# Patient Record
Sex: Female | Born: 1960 | Race: White | Hispanic: No | Smoking: Former smoker
Health system: Southern US, Community
[De-identification: ages and names within clinical notes are randomized; demographics above are authoritative.]

## PROBLEM LIST (undated history)

## (undated) DIAGNOSIS — N83209 Unspecified ovarian cyst, unspecified side: Secondary | ICD-10-CM

## (undated) DIAGNOSIS — T7840XA Allergy, unspecified, initial encounter: Secondary | ICD-10-CM

## (undated) DIAGNOSIS — K635 Polyp of colon: Secondary | ICD-10-CM

## (undated) DIAGNOSIS — E039 Hypothyroidism, unspecified: Secondary | ICD-10-CM

## (undated) DIAGNOSIS — D649 Anemia, unspecified: Secondary | ICD-10-CM

## (undated) DIAGNOSIS — D039 Melanoma in situ, unspecified: Secondary | ICD-10-CM

## (undated) DIAGNOSIS — C439 Malignant melanoma of skin, unspecified: Secondary | ICD-10-CM

## (undated) DIAGNOSIS — J302 Other seasonal allergic rhinitis: Secondary | ICD-10-CM

## (undated) DIAGNOSIS — S62101A Fracture of unspecified carpal bone, right wrist, initial encounter for closed fracture: Secondary | ICD-10-CM

## (undated) HISTORY — PX: DILATION AND CURETTAGE OF UTERUS: SHX78

## (undated) HISTORY — DX: Anemia, unspecified: D64.9

## (undated) HISTORY — DX: Hypothyroidism, unspecified: E03.9

## (undated) HISTORY — PX: OTHER SURGICAL HISTORY: SHX169

## (undated) HISTORY — DX: Fracture of unspecified carpal bone, right wrist, initial encounter for closed fracture: S62.101A

## (undated) HISTORY — DX: Melanoma in situ, unspecified: D03.9

## (undated) HISTORY — DX: Malignant melanoma of skin, unspecified: C43.9

## (undated) HISTORY — DX: Unspecified ovarian cyst, unspecified side: N83.209

## (undated) HISTORY — PX: TUBAL LIGATION: SHX77

## (undated) HISTORY — DX: Polyp of colon: K63.5

## (undated) HISTORY — PX: SHOULDER SURGERY: SHX246

## (undated) HISTORY — DX: Other seasonal allergic rhinitis: J30.2

## (undated) HISTORY — DX: Allergy, unspecified, initial encounter: T78.40XA

---

## 1997-11-06 DIAGNOSIS — K635 Polyp of colon: Secondary | ICD-10-CM

## 1997-11-06 HISTORY — DX: Polyp of colon: K63.5

## 1997-11-06 HISTORY — PX: COLONOSCOPY W/ BIOPSIES: SHX1374

## 1997-11-18 ENCOUNTER — Other Ambulatory Visit: Admission: RE | Admit: 1997-11-18 | Discharge: 1997-11-18 | Payer: Self-pay | Admitting: Obstetrics and Gynecology

## 1997-11-21 ENCOUNTER — Ambulatory Visit (HOSPITAL_COMMUNITY): Admission: RE | Admit: 1997-11-21 | Discharge: 1997-11-21 | Payer: Self-pay | Admitting: Gastroenterology

## 1999-06-21 ENCOUNTER — Other Ambulatory Visit: Admission: RE | Admit: 1999-06-21 | Discharge: 1999-06-21 | Payer: Self-pay | Admitting: Obstetrics and Gynecology

## 1999-08-18 ENCOUNTER — Encounter: Admission: RE | Admit: 1999-08-18 | Discharge: 1999-08-18 | Payer: Self-pay | Admitting: Obstetrics and Gynecology

## 1999-08-18 ENCOUNTER — Encounter: Payer: Self-pay | Admitting: Obstetrics and Gynecology

## 2000-08-03 ENCOUNTER — Other Ambulatory Visit: Admission: RE | Admit: 2000-08-03 | Discharge: 2000-08-03 | Payer: Self-pay | Admitting: Obstetrics and Gynecology

## 2000-10-31 ENCOUNTER — Encounter: Admission: RE | Admit: 2000-10-31 | Discharge: 2000-10-31 | Payer: Self-pay | Admitting: Obstetrics and Gynecology

## 2000-10-31 ENCOUNTER — Encounter: Payer: Self-pay | Admitting: Obstetrics and Gynecology

## 2000-11-15 ENCOUNTER — Ambulatory Visit (HOSPITAL_COMMUNITY): Admission: RE | Admit: 2000-11-15 | Discharge: 2000-11-15 | Payer: Self-pay | Admitting: Gastroenterology

## 2000-11-15 ENCOUNTER — Encounter (INDEPENDENT_AMBULATORY_CARE_PROVIDER_SITE_OTHER): Payer: Self-pay | Admitting: Specialist

## 2001-09-24 ENCOUNTER — Other Ambulatory Visit: Admission: RE | Admit: 2001-09-24 | Discharge: 2001-09-24 | Payer: Self-pay | Admitting: Obstetrics and Gynecology

## 2001-12-05 ENCOUNTER — Encounter: Payer: Self-pay | Admitting: Obstetrics and Gynecology

## 2001-12-05 ENCOUNTER — Encounter: Admission: RE | Admit: 2001-12-05 | Discharge: 2001-12-05 | Payer: Self-pay | Admitting: Obstetrics and Gynecology

## 2002-09-23 ENCOUNTER — Other Ambulatory Visit: Admission: RE | Admit: 2002-09-23 | Discharge: 2002-09-23 | Payer: Self-pay | Admitting: Obstetrics and Gynecology

## 2003-01-03 ENCOUNTER — Encounter: Payer: Self-pay | Admitting: Obstetrics and Gynecology

## 2003-01-03 ENCOUNTER — Encounter: Admission: RE | Admit: 2003-01-03 | Discharge: 2003-01-03 | Payer: Self-pay | Admitting: Obstetrics and Gynecology

## 2003-11-24 ENCOUNTER — Ambulatory Visit (HOSPITAL_COMMUNITY): Admission: RE | Admit: 2003-11-24 | Discharge: 2003-11-24 | Payer: Self-pay | Admitting: Gastroenterology

## 2003-11-24 ENCOUNTER — Encounter (INDEPENDENT_AMBULATORY_CARE_PROVIDER_SITE_OTHER): Payer: Self-pay | Admitting: Specialist

## 2003-12-09 ENCOUNTER — Other Ambulatory Visit: Admission: RE | Admit: 2003-12-09 | Discharge: 2003-12-09 | Payer: Self-pay | Admitting: Internal Medicine

## 2004-01-13 ENCOUNTER — Encounter: Admission: RE | Admit: 2004-01-13 | Discharge: 2004-01-13 | Payer: Self-pay | Admitting: Obstetrics and Gynecology

## 2004-12-17 ENCOUNTER — Other Ambulatory Visit: Admission: RE | Admit: 2004-12-17 | Discharge: 2004-12-17 | Payer: Self-pay | Admitting: *Deleted

## 2005-01-25 ENCOUNTER — Encounter: Admission: RE | Admit: 2005-01-25 | Discharge: 2005-01-25 | Payer: Self-pay | Admitting: Obstetrics and Gynecology

## 2005-07-05 ENCOUNTER — Ambulatory Visit: Payer: Self-pay | Admitting: Internal Medicine

## 2005-07-19 ENCOUNTER — Ambulatory Visit: Payer: Self-pay | Admitting: Internal Medicine

## 2005-11-06 HISTORY — PX: INTRAUTERINE DEVICE (IUD) INSERTION: SHX5877

## 2005-12-21 ENCOUNTER — Other Ambulatory Visit: Admission: RE | Admit: 2005-12-21 | Discharge: 2005-12-21 | Payer: Self-pay | Admitting: Obstetrics & Gynecology

## 2006-01-26 ENCOUNTER — Encounter: Admission: RE | Admit: 2006-01-26 | Discharge: 2006-01-26 | Payer: Self-pay | Admitting: Obstetrics & Gynecology

## 2006-06-29 ENCOUNTER — Ambulatory Visit: Payer: Self-pay | Admitting: Internal Medicine

## 2006-07-29 ENCOUNTER — Ambulatory Visit: Payer: Self-pay | Admitting: Family Medicine

## 2007-01-31 ENCOUNTER — Encounter: Admission: RE | Admit: 2007-01-31 | Discharge: 2007-01-31 | Payer: Self-pay | Admitting: Obstetrics & Gynecology

## 2007-02-01 ENCOUNTER — Other Ambulatory Visit: Admission: RE | Admit: 2007-02-01 | Discharge: 2007-02-01 | Payer: Self-pay | Admitting: Obstetrics & Gynecology

## 2007-05-11 ENCOUNTER — Encounter: Payer: Self-pay | Admitting: Internal Medicine

## 2007-05-11 ENCOUNTER — Ambulatory Visit: Payer: Self-pay | Admitting: Vascular Surgery

## 2007-05-24 ENCOUNTER — Ambulatory Visit: Payer: Self-pay | Admitting: Vascular Surgery

## 2007-08-02 ENCOUNTER — Telehealth: Payer: Self-pay | Admitting: Internal Medicine

## 2008-02-05 ENCOUNTER — Encounter: Admission: RE | Admit: 2008-02-05 | Discharge: 2008-02-05 | Payer: Self-pay | Admitting: Obstetrics & Gynecology

## 2008-02-11 ENCOUNTER — Other Ambulatory Visit: Admission: RE | Admit: 2008-02-11 | Discharge: 2008-02-11 | Payer: Self-pay | Admitting: Obstetrics & Gynecology

## 2008-11-25 ENCOUNTER — Telehealth: Payer: Self-pay | Admitting: Internal Medicine

## 2008-12-01 ENCOUNTER — Ambulatory Visit: Payer: Self-pay | Admitting: Internal Medicine

## 2008-12-01 DIAGNOSIS — B079 Viral wart, unspecified: Secondary | ICD-10-CM | POA: Insufficient documentation

## 2009-02-05 ENCOUNTER — Encounter: Admission: RE | Admit: 2009-02-05 | Discharge: 2009-02-05 | Payer: Self-pay | Admitting: Obstetrics & Gynecology

## 2009-10-01 ENCOUNTER — Telehealth: Payer: Self-pay | Admitting: Internal Medicine

## 2009-10-07 ENCOUNTER — Encounter: Payer: Self-pay | Admitting: Internal Medicine

## 2009-11-26 ENCOUNTER — Ambulatory Visit: Payer: Self-pay | Admitting: Internal Medicine

## 2009-11-26 LAB — CONVERTED CEMR LAB: Albumin: 4.2 g/dL (ref 3.5–5.2)

## 2010-02-08 ENCOUNTER — Encounter: Admission: RE | Admit: 2010-02-08 | Discharge: 2010-02-08 | Payer: Self-pay | Admitting: Obstetrics & Gynecology

## 2010-04-29 ENCOUNTER — Ambulatory Visit (HOSPITAL_COMMUNITY)
Admission: RE | Admit: 2010-04-29 | Discharge: 2010-04-29 | Payer: Self-pay | Source: Home / Self Care | Attending: Gastroenterology | Admitting: Gastroenterology

## 2010-04-30 ENCOUNTER — Encounter: Payer: Self-pay | Admitting: Internal Medicine

## 2010-06-10 NOTE — Progress Notes (Signed)
Summary: toe fungus  Phone Note Call from Patient   Caller: Patient Call For: Stacie Glaze MD Summary of Call: [phone] Wants RX for toe fungus. [facility] ([place]) Initial call taken by: Lynann Beaver CMA,  Oct 01, 2009 2:07 PM  Follow-up for Phone Call        lamisil generic 250 mg one by mouth dauly for 90 days needs liver function in 6 weeks ( terbinifin) Follow-up by: Stacie Glaze MD,  Oct 01, 2009 4:19 PM    New/Updated Medications: LAMISIL 250 MG TABS (TERBINAFINE HCL) one by mouth daily x 90 days TERBINAFINE HCL 250 MG TABS (TERBINAFINE HCL) one by mouth daily x 90 days Prescriptions: TERBINAFINE HCL 250 MG TABS (TERBINAFINE HCL) one by mouth daily x 90 days  #90 x 0   Entered by:   Lynann Beaver CMA   Authorized by:   Stacie Glaze MD   Signed by:   Lynann Beaver CMA on 10/01/2009   Method used:   Electronically to        [facility] [phone]* (retail)       [address]       [place], [place]  [address]       Ph: [phone]       Fax: [phone]   RxID:   [id number] LAMISIL 250 MG TABS (TERBINAFINE HCL) one by mouth daily x 90 days  #90 x 0   Entered by:   Lynann Beaver CMA   Authorized by:   Stacie Glaze MD   Signed by:   Lynann Beaver CMA on 10/01/2009   Method used:   Electronically to        [facility] [phone]* (retail)       [address]       [place], [place]  [address]       Ph: [phone]       Fax: [phone]   RxID:   [id number]  Pt notified.

## 2010-06-10 NOTE — Medication Information (Signed)
Summary: Denial of Coverage for Terbinafine Hcl  Denial of Coverage for Terbinafine Hcl   Imported By: Maryln Gottron 10/23/2009 10:13:45  _____________________________________________________________________  External Attachment:    Type:   Image     Comment:   External Document

## 2010-06-10 NOTE — Procedures (Signed)
Summary: Colonoscopy Report/[facility]  Colonoscopy Report/[facility]   Imported By: Maryln Gottron 05/14/2010 13:32:51  _____________________________________________________________________  External Attachment:    Type:   Image     Comment:   External Document

## 2010-09-21 NOTE — Consult Note (Signed)
NEW PATIENT CONSULTATION   CASSIDEY, BARRALES  DOB:  [birth year]                                       05/11/2007  CHART#:[id number]   HISTORY:  The patient presents today for evaluation of lower extremity  venous pathology.  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  She did not recall any formal duplex  evaluation at that time.  She does not have any swelling or venous  varicosities.  She does not have any pain related to this.   PAST MEDICAL HISTORY:  Her past history is negative for major medical  difficulties.   SOCIAL HISTORY:  She does not smoke, having quit in 1985 and has 2-3  alcoholic drinks per week.   REVIEW OF SYSTEMS:  Her review of systems is completely negative.   ALLERGIES:  She has no allergies.   MEDICATIONS:  She is on no meds.   PHYSICAL EXAMINATION:  Vital signs:  Her weight is 170 pounds.  Height  is 5 feet 6 inches.  Blood pressure is 140/86, pulse 70, respirations  16.  General:  A well-developed, well-nourished white female appearing  her stated age of 78.  Extremities:  Her dorsalis pedis pulses are 2+  bilaterally.  She does not have any swelling and does not have any  changes of venous hypertension.  She does have spider vein  telangiectasia more on her right side than her left and more on the  right lateral thigh and right lateral calf.   She underwent a limited venous duplex by me and this showed normal  caliber saphenous vein bilaterally with no evidence of gross reflux.  I  discussed this with the patient.  I explained she does not appear to  have any significant or serious venous disease other than her spider  vein telangiectasia.  She is concerned of these from a cosmetic  standpoint.  I did discuss treatment of these.  I explained that we  currently do not use hypertonic saline due to the pain and potential  hyperpigmentation associated with this.  I explained that Ms. Octavio Graves, RN,  currently performed sclerotherapy in our office and we will schedule an  appointment for her for treatment.   Larina Earthly, M.D.  Electronically Signed   TFE/MEDQ  D:  05/11/2007  T:  05/14/2007  Job:  [id number]   cc:   Stacie Glaze, MD

## 2010-09-24 NOTE — Op Note (Signed)
NAME:  Rachael Munoz, Rachael Munoz                         ACCOUNT NO.:  [id number]   MEDICAL RECORD NO.:  [id number]                   PATIENT TYPE:  AMB   LOCATION:  ENDO                                 FACILITY:  [facility]   PHYSICIAN:  Danise Edge, M.D.                DATE OF BIRTH:  07/30/1960   DATE OF PROCEDURE:  11/24/2003  DATE OF DISCHARGE:                                 OPERATIVE REPORT   PROCEDURE PERFORMED:  Colonoscopy and polypectomy.   PROCEDURE INDICATION:  Ms. Sireen Halk is a 50 year old female born  28-May-1960.  In 1999, Ms. 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  On 11/15/2000,  proctocolonoscopy to the cecum was normal.   ENDOSCOPIST:  Danise Edge, M.D.   PREMEDICATION:  Versed 7.5 mg, fentanyl 60 mcg.   PROCEDURE:  After obtaining informed consent, Ms. Arango was placed in the  left lateral decubitus position.  I administered intravenous fentanyl and  intravenous Versed to achieve conscious sedation for the procedure.  The  patient's blood pressure, oxygen saturation, and cardiac rhythm were  monitored throughout the procedure and documented in the medical record.   Anal inspection and digital rectal exam were normal.  The Olympus adjustable  pediatric colonoscope was introduced into the rectum and advanced to the  cecum.  A normal-appearing ileocecal valve was intubated and the distal  ileum inspected.  Colonic preparation for the exam today was excellent.   Rectum:  Normal.   Sigmoid colon and descending colon:  Two diminutive polyps were removed with  the cold biopsy forceps at approximately 60 cm from the anal verge.   Splenic flexure:  Normal.   Transverse colon:  Normal.   Hepatic flexure:  A 2-mm polyp was lifted by submucosal saline injection and  removed with the electrocautery snare.   Ascending colon:  Normal.   Cecum and ileocecal valve:  Normal.   ASSESSMENT:  A 2-mm size polyp was  removed from the hepatic flexure, and two  diminutive polyps were removed from the proximal sigmoid colon.  All polyps  were submitted in one bottle for pathological evaluation.   PLAN:  Repeat colonoscopy in three years.                                               Danise Edge, M.D.    MJ/MEDQ  D:  11/24/2003  T:  11/24/2003  Job:  [id number]

## 2010-09-24 NOTE — Procedures (Signed)
[place]  Patient:    Rachael Munoz, Rachael Munoz                        MRN: [id number] Proc. Date: 11/15/00 Attending:  Verlin Grills, M.D.                           Procedure Report  DATE OF BIRTH:  1960-11-07.  REFERRING PHYSICIAN:  PROCEDURE PERFORMED:  Colonoscopy with polypectomy.  ENDOSCOPIST:  Verlin Grills, M.D.  INDICATIONS FOR PROCEDURE:  The patient is a 50 year old female.  In 1999 she underwent a colonoscopy; a 2 cm tubulovillous adenoma was removed from her rectum.  She is due for surveillance colonoscopy and polypectomy to prevent colon cancer.  PREMEDICATION:  Fentanyl 50 mcg, Versed 7 mg.  ENDOSCOPE:  Olympus pediatric video colonoscope.  DESCRIPTION OF PROCEDURE:  After obtaining informed consent, the patient was placed in the left lateral decubitus position.  I administered intravenous fentanyl and intravenous Versed to achieve conscious sedation for the procedure.  The patients blood pressure, oxygen saturation and cardiac rhythm were monitored throughout the procedure and documented in the medical record.  Anal inspection was normal.  Digital rectal exam was normal.  The Olympus pediatric video colonoscope was then introduced into the rectum easily advanced to the cecum.  A normal-appearing ileocecal valve was intubated and the distal ileum inspected.  Colonic preparation for the exam today was excellent.  Rectum:  Normal.  Sigmoid colon and descending colon:  At approximately 30 cm from the anal verge, a 2 mm sessile polyp was removed with the electrocautery snare and submitted for pathological interpretation.  Splenic flexure:  Normal.  Transverse colon:  Normal.  Hepatic flexure:  Normal.  Ascending colon:  Normal.  Cecum and ileocecal valve:  Normal.  Distal ileum:  Normal.  ASSESSMENT:   From the distal sigmoid colon at 30 cm from the anal verge, a 2 mm sessile polyp was removed with  electrocautery snare.  Otherwise normal proctocolonoscopy to the cecum.  RECOMMENDATIONS:  Repeat colonoscopy in July 2007. DD:  11/15/00 TD:  11/15/00 Job: [id number]

## 2010-10-08 ENCOUNTER — Telehealth: Payer: Self-pay | Admitting: *Deleted

## 2010-10-08 NOTE — Telephone Encounter (Signed)
Per dr Lovell Sheehan- needs to be seen- cant diagnosis problem without being seen and hasnt see dr Lovell Sheehan in 3 years

## 2010-10-08 NOTE — Telephone Encounter (Signed)
Pt keeps getting infected toes from Pedicures, and wants to be treated over the phone.  Will give [place] the name of the pharmacy after she speaks to [place]?

## 2010-10-12 ENCOUNTER — Telehealth: Payer: Self-pay | Admitting: *Deleted

## 2010-10-12 NOTE — Telephone Encounter (Signed)
Notified pt she needs appt to have a MD to look at her toenail problems.

## 2010-10-20 ENCOUNTER — Ambulatory Visit (INDEPENDENT_AMBULATORY_CARE_PROVIDER_SITE_OTHER): Payer: BC Managed Care – PPO | Admitting: Internal Medicine

## 2010-10-20 ENCOUNTER — Encounter: Payer: Self-pay | Admitting: Internal Medicine

## 2010-10-20 VITALS — BP 124/82 | HR 76 | Temp 98.4°F | Ht 65.0 in | Wt 183.0 lb

## 2010-10-20 DIAGNOSIS — B351 Tinea unguium: Secondary | ICD-10-CM

## 2010-10-20 DIAGNOSIS — B079 Viral wart, unspecified: Secondary | ICD-10-CM

## 2010-10-20 MED ORDER — FLUCONAZOLE 100 MG PO TABS: 100.0000 mg | ORAL_TABLET | Freq: Every day | ORAL | Status: AC

## 2010-10-20 NOTE — Progress Notes (Signed)
  Subjective:    Patient ID: Rachael Munoz, female    DOB: 1960-10-29, 50 y.o.   MRN: [id number]  HPI  Visit for recurrent fungal toenails. They are raised and painful and she failed a 90 day course of Lamisil.   Review of Systems  Constitutional: Negative for activity change, appetite change and fatigue.  HENT: Negative for ear pain, congestion, neck pain, postnasal drip and sinus pressure.   Eyes: Negative for redness and visual disturbance.  Respiratory: Negative for cough, shortness of breath and wheezing.   Gastrointestinal: Negative for abdominal pain and abdominal distention.  Genitourinary: Negative for dysuria, frequency and menstrual problem.  Musculoskeletal: Negative for myalgias, joint swelling and arthralgias.  Skin: Negative for rash and wound.  Neurological: Negative for dizziness, weakness and headaches.  Hematological: Negative for adenopathy. Does not bruise/bleed easily.  Psychiatric/Behavioral: Negative for sleep disturbance and decreased concentration.      History reviewed. No pertinent past medical history. History reviewed. No pertinent past surgical history.  reports that she quit smoking about 25 years ago. She does not have any smokeless tobacco history on file. Her alcohol and drug histories not on file. family history is not on file. No Known Allergies  Objective:   Physical Exam  Constitutional: She is oriented to person, place, and time. She appears well-developed and well-nourished. No distress.  HENT:  Head: Normocephalic and atraumatic.  Right Ear: External ear normal.  Left Ear: External ear normal.  Nose: Nose normal.  Mouth/Throat: Oropharynx is clear and moist.  Eyes: Conjunctivae and EOM are normal. Pupils are equal, round, and reactive to light.  Neck: Normal range of motion. Neck supple. No JVD present. No tracheal deviation present. No thyromegaly present.  Cardiovascular: Normal rate, regular rhythm, normal heart sounds and intact  distal pulses.   No murmur heard. Pulmonary/Chest: Effort normal and breath sounds normal. She has no wheezes. She exhibits no tenderness.  Abdominal: Soft. Bowel sounds are normal.  Musculoskeletal: Normal range of motion. She exhibits no edema and no tenderness.  Lymphadenopathy:    She has no cervical adenopathy.  Neurological: She is alert and oriented to person, place, and time. She has normal reflexes. No cranial nerve deficit.  Skin: Skin is warm and dry. She is not diaphoretic.  Psychiatric: She has a normal mood and affect. Her behavior is normal.          Assessment & Plan:  Failure of Lamisil to treat a fungal nails.  Will try Diflucan 100 mg daily along with vinegar soaks.  She is a wart recurrence on her middle finger on the right hand.  Treatment with cryotherapy for 30 seconds  Informed consent was obtained in the lesion was treated for 60 seconds of liquid nitrogen application the patient tolerated the procedure well as procedural care was discussed with the patient and instructions should the lesion reappears contact our office immediately

## 2010-10-20 NOTE — Patient Instructions (Signed)
You're going to take a medication called Diflucan 100 mg once a day and we will take it for 30 days take a small break and then resume for another 30  I would like you to soak her feet at least 4 times a week for 15-20 minutes in warm water and vinegar soak. Use a gallon of warm water in a tub and had a cup of white vinegar

## 2010-12-20 ENCOUNTER — Telehealth: Payer: Self-pay | Admitting: Internal Medicine

## 2010-12-20 NOTE — Telephone Encounter (Signed)
Pt said that the wart Dr Lovell Sheehan froze off, has come back. Pt is wanting to know, since her Sept ov has been rsc to Oct, if pt can come in to see any doctor to get wart refrozen and if so, would there be another copay, since the procedure didn't fully remove wart the first time?

## 2010-12-21 NOTE — Telephone Encounter (Signed)
Left message on machine for pt 2 times to  Call back and no return call

## 2010-12-23 ENCOUNTER — Ambulatory Visit: Payer: BC Managed Care – PPO | Admitting: Internal Medicine

## 2010-12-23 ENCOUNTER — Encounter: Payer: Self-pay | Admitting: Internal Medicine

## 2010-12-23 ENCOUNTER — Ambulatory Visit (INDEPENDENT_AMBULATORY_CARE_PROVIDER_SITE_OTHER): Payer: BC Managed Care – PPO | Admitting: Internal Medicine

## 2010-12-23 DIAGNOSIS — B079 Viral wart, unspecified: Secondary | ICD-10-CM

## 2010-12-23 NOTE — Progress Notes (Signed)
  Subjective:    Patient ID: Rachael Munoz, female    DOB: 04/11/1961, 50 y.o.   MRN: [id number]  HPI   The patient is a 50 year old white female presents for wart destruction.  She has a wart on her right index finger that is recurrent she states it is tender and painful it involves the pad of her finger.   Review of Systems  Constitutional: Negative for activity change, appetite change and fatigue.  HENT: Negative for ear pain, congestion, neck pain, postnasal drip and sinus pressure.   Eyes: Negative for redness and visual disturbance.  Respiratory: Negative for cough, shortness of breath and wheezing.   Gastrointestinal: Negative for abdominal pain and abdominal distention.  Genitourinary: Negative for dysuria, frequency and menstrual problem.  Musculoskeletal: Negative for myalgias, joint swelling and arthralgias.  Skin: Negative for rash and wound.  Neurological: Negative for dizziness, weakness and headaches.  Hematological: Negative for adenopathy. Does not bruise/bleed easily.  Psychiatric/Behavioral: Negative for sleep disturbance and decreased concentration.       Objective:   Physical Exam  Constitutional: She is oriented to person, place, and time. She appears well-developed and well-nourished. No distress.  HENT:  Head: Normocephalic and atraumatic.  Right Ear: External ear normal.  Left Ear: External ear normal.  Nose: Nose normal.  Mouth/Throat: Oropharynx is clear and moist.  Eyes: Conjunctivae and EOM are normal. Pupils are equal, round, and reactive to light.  Neck: Normal range of motion. Neck supple. No JVD present. No tracheal deviation present. No thyromegaly present.  Cardiovascular: Normal rate, regular rhythm, normal heart sounds and intact distal pulses.   No murmur heard. Pulmonary/Chest: Effort normal and breath sounds normal. She has no wheezes. She exhibits no tenderness.  Abdominal: Soft. Bowel sounds are normal.  Musculoskeletal: Normal range  of motion. She exhibits no edema and no tenderness.  Lymphadenopathy:    She has no cervical adenopathy.  Neurological: She is alert and oriented to person, place, and time. She has normal reflexes. No cranial nerve deficit.  Skin: Skin is warm and dry. She is not diaphoretic.  Psychiatric: She has a normal mood and affect. Her behavior is normal.          Assessment & Plan:  Treatment of wart with cryotherapy.  Phone consent obtained and 50 seconds of liquid nitrogen were applied to the area to work until there was solid freezing post sheet and wound care instructions were given to the patient

## 2011-01-17 ENCOUNTER — Other Ambulatory Visit: Payer: Self-pay | Admitting: Obstetrics & Gynecology

## 2011-01-17 DIAGNOSIS — Z1231 Encounter for screening mammogram for malignant neoplasm of breast: Secondary | ICD-10-CM

## 2011-01-21 ENCOUNTER — Ambulatory Visit: Payer: BC Managed Care – PPO | Admitting: Internal Medicine

## 2011-02-10 ENCOUNTER — Ambulatory Visit
Admission: RE | Admit: 2011-02-10 | Discharge: 2011-02-10 | Disposition: A | Discharge status: Home / Self Care | Payer: BC Managed Care – PPO | Source: Ambulatory Visit | Attending: Obstetrics & Gynecology | Admitting: Obstetrics & Gynecology

## 2011-02-10 DIAGNOSIS — Z1231 Encounter for screening mammogram for malignant neoplasm of breast: Secondary | ICD-10-CM

## 2011-02-18 ENCOUNTER — Ambulatory Visit: Payer: BC Managed Care – PPO | Admitting: Internal Medicine

## 2011-04-09 HISTORY — PX: IUD REMOVAL: SHX5392

## 2011-08-22 ENCOUNTER — Encounter: Payer: Self-pay | Admitting: Internal Medicine

## 2011-08-22 ENCOUNTER — Ambulatory Visit (INDEPENDENT_AMBULATORY_CARE_PROVIDER_SITE_OTHER): Payer: BC Managed Care – PPO | Admitting: Internal Medicine

## 2011-08-22 VITALS — BP 140/90 | HR 72 | Temp 98.2°F | Resp 16 | Ht 65.0 in | Wt 190.0 lb

## 2011-08-22 DIAGNOSIS — M79672 Pain in left foot: Secondary | ICD-10-CM

## 2011-08-22 DIAGNOSIS — M79609 Pain in unspecified limb: Secondary | ICD-10-CM

## 2011-08-22 DIAGNOSIS — E039 Hypothyroidism, unspecified: Secondary | ICD-10-CM

## 2011-08-22 DIAGNOSIS — M109 Gout, unspecified: Secondary | ICD-10-CM

## 2011-08-22 LAB — T3, FREE: T3, Free: 2.6 pg/mL (ref 2.3–4.2)

## 2011-08-22 LAB — T4, FREE: Free T4: 0.95 ng/dL (ref 0.60–1.60)

## 2011-08-22 LAB — URIC ACID: Uric Acid, Serum: 3.8 mg/dL (ref 2.4–7.0)

## 2011-08-22 LAB — TSH: TSH: 1.54 u[IU]/mL (ref 0.35–5.50)

## 2011-08-22 MED ORDER — INDOMETHACIN 50 MG PO CAPS: 50.0000 mg | ORAL_CAPSULE | Freq: Three times a day (TID) | ORAL | Status: AC

## 2011-08-22 MED ORDER — COLCHICINE 0.6 MG PO TABS: 0.6000 mg | ORAL_TABLET | Freq: Two times a day (BID) | ORAL | Status: DC

## 2011-08-22 NOTE — Patient Instructions (Addendum)
The patient is instructed to continue all medications as prescribed. Schedule followup with check out clerk upon leaving the clinic  I believe the significant amount of pain in your foot and warmth that I feel represents a gouty attack. I have attached an information sheet about gout. You'll be taking colchicine twice daily for about a week and indomethacin 3 times a day for a couple of weeks This helps to block the gout out of the joint very quickly and You may feel very good after 2 days adult start exercising for 3-4d  Gout Gout is an inflammatory condition (arthritis) caused by a buildup of uric acid crystals in the joints. Uric acid is a chemical that is normally present in the blood. Under some circumstances, uric acid can form into crystals in your joints. This causes joint redness, soreness, and swelling (inflammation). Repeat attacks are common. Over time, uric acid crystals can form into masses (tophi) near a joint, causing disfigurement. Gout is treatable and often preventable. CAUSES  The disease begins with elevated levels of uric acid in the blood. Uric acid is produced by your body when it breaks down a naturally found substance called purines. This also happens when you eat certain foods such as meats and fish. Causes of an elevated uric acid level include:  Being passed down from parent to child (heredity).   Diseases that cause increased uric acid production (obesity, psoriasis, some cancers).   Excessive alcohol use.   Diet, especially diets rich in meat and seafood.   Medicines, including certain cancer-fighting drugs (chemotherapy), diuretics, and aspirin.   Chronic kidney disease. The kidneys are no longer able to remove uric acid well.   Problems with metabolism.  Conditions strongly associated with gout include:  Obesity.   High blood pressure.   High cholesterol.   Diabetes.  Not everyone with elevated uric acid levels gets gout. It is not understood why  some people get gout and others do not. Surgery, joint injury, and eating too much of certain foods are some of the factors that can lead to gout. SYMPTOMS   An attack of gout comes on quickly. It causes intense pain with redness, swelling, and warmth in a joint.   Fever can occur.   Often, only one joint is involved. Certain joints are more commonly involved:   Base of the big toe.   Knee.   Ankle.   Wrist.   Finger.  Without treatment, an attack usually goes away in a few days to weeks. Between attacks, you usually will not have symptoms, which is different from many other forms of arthritis. DIAGNOSIS  Your caregiver will suspect gout based on your symptoms and exam. Removal of fluid from the joint (arthrocentesis) is done to check for uric acid crystals. Your caregiver will give you a medicine that numbs the area (local anesthetic) and use a needle to remove joint fluid for exam. Gout is confirmed when uric acid crystals are seen in joint fluid, using a special microscope. Sometimes, blood, urine, and X-ray tests are also used. TREATMENT  There are 2 phases to gout treatment: treating the sudden onset (acute) attack and preventing attacks (prophylaxis). Treatment of an Acute Attack  Medicines are used. These include anti-inflammatory medicines or steroid medicines.   An injection of steroid medicine into the affected joint is sometimes necessary.   The painful joint is rested. Movement can worsen the arthritis.   You may use warm or cold treatments on painful joints, depending which works  best for you.   Discuss the use of coffee, vitamin C, or cherries with your caregiver. These may be helpful treatment options.  Treatment to Prevent Attacks After the acute attack subsides, your caregiver may advise prophylactic medicine. These medicines either help your kidneys eliminate uric acid from your body or decrease your uric acid production. You may need to stay on these medicines  for a very long time. The early phase of treatment with prophylactic medicine can be associated with an increase in acute gout attacks. For this reason, during the first few months of treatment, your caregiver may also advise you to take medicines usually used for acute gout treatment. Be sure you understand your caregiver's directions. You should also discuss dietary treatment with your caregiver. Certain foods such as meats and fish can increase uric acid levels. Other foods such as dairy can decrease levels. Your caregiver can give you a list of foods to avoid. HOME CARE INSTRUCTIONS   Do not take aspirin to relieve pain. This raises uric acid levels.   Only take over-the-counter or prescription medicines for pain, discomfort, or fever as directed by your caregiver.   Rest the joint as much as possible. When in bed, keep sheets and blankets off painful areas.   Keep the affected joint raised (elevated).   Use crutches if the painful joint is in your leg.   Drink enough water and fluids to keep your urine clear or pale yellow. This helps your body get rid of uric acid. Do not drink alcoholic beverages. They slow the passage of uric acid.   Follow your caregiver's dietary instructions. Pay careful attention to the amount of protein you eat. Your daily diet should emphasize fruits, vegetables, whole grains, and fat-free or low-fat milk products.   Maintain a healthy body weight.  SEEK MEDICAL CARE IF:   You have an oral temperature above 102 F (38.9 C).   You develop diarrhea, vomiting, or any side effects from medicines.   You do not feel better in 24 hours, or you are getting worse.  SEEK IMMEDIATE MEDICAL CARE IF:   Your joint becomes suddenly more tender and you have:   Chills.   An oral temperature above 102 F (38.9 C), not controlled by medicine.  MAKE SURE YOU:   Understand these instructions.   Will watch your condition.   Will get help right away if you are not  doing well or get worse.  Document Released: 04/22/2000 Document Revised: 04/14/2011 Document Reviewed: 08/03/2009 [facility] Patient Information 2012 [place], [place].

## 2011-08-22 NOTE — Progress Notes (Signed)
  Subjective:    Patient ID: Rachael Munoz, female    DOB: 1961-01-10, 51 y.o.   MRN: [id number]  HPI Painful swelling in her feet that began with starting the synthroid 25 mcg. Most of the edema is isolated in the left foot. No SOB. No trauma. Effecting gate. No gout hx.     Review of Systems  Constitutional: Negative.   HENT: Negative.   Eyes: Negative.   Respiratory: Negative.   Cardiovascular: Negative.   Gastrointestinal: Negative.   Musculoskeletal: Positive for myalgias and joint swelling.   No past medical history on file.  History   Social History  . Marital Status: Legally Separated    Spouse Name: N/A    Number of Children: N/A  . Years of Education: N/A   Occupational History  . Not on file.   Social History Main Topics  . Smoking status: Former Smoker    Quit date: 10/19/1985  . Smokeless tobacco: Not on file  . Alcohol Use: No  . Drug Use: No  . Sexually Active: Not on file   Other Topics Concern  . Not on file   Social History Narrative  . No narrative on file    No past surgical history on file.  No family history on file.  No Known Allergies  No current outpatient prescriptions on file prior to visit.    BP 140/90  Pulse 72  Temp 98.2 F (36.8 C)  Resp 16  Ht 5\' 5"  (1.651 m)  Wt 190 lb (86.183 kg)  BMI 31.62 kg/m2       Objective:   Physical Exam  Nursing note and vitals reviewed. Constitutional: She appears well-developed and well-nourished.  HENT:  Head: Normocephalic and atraumatic.  Eyes: Conjunctivae normal are normal. Pupils are equal, round, and reactive to light.  Cardiovascular: Normal rate and regular rhythm.   Pulmonary/Chest: Effort normal and breath sounds normal.  Musculoskeletal: She exhibits edema and tenderness.  Skin:       No visible rash Trace edema of the lower extremities left greater than right          Assessment & Plan:  Left foot greater than right foot swelling but swelling indeed in  both feet. She states that this began since she started the Synthroid 25 mg we'll monitor her thyroid and see if she needs to be on this medication we will check a T3 and a T4.  I believe this is an inflammatory reaction in the differential diagnosis is arthritis of the feet as well as the possibility of a Morton's neuroma. Differential diagnosis also includes gout other less likely we will measure uric acid

## 2011-08-23 ENCOUNTER — Other Ambulatory Visit: Payer: Self-pay | Admitting: Internal Medicine

## 2011-08-23 DIAGNOSIS — M79672 Pain in left foot: Secondary | ICD-10-CM

## 2011-08-23 NOTE — Progress Notes (Signed)
Pt informed and states foot is some better and will stop meds that she was instructed to stop--xray ordered and pt informed

## 2011-08-24 ENCOUNTER — Ambulatory Visit (INDEPENDENT_AMBULATORY_CARE_PROVIDER_SITE_OTHER)
Admission: RE | Admit: 2011-08-24 | Discharge: 2011-08-24 | Disposition: A | Discharge status: Home / Self Care | Payer: BC Managed Care – PPO | Source: Ambulatory Visit | Attending: Internal Medicine | Admitting: Internal Medicine

## 2011-08-24 DIAGNOSIS — M79609 Pain in unspecified limb: Secondary | ICD-10-CM

## 2011-08-24 DIAGNOSIS — M79672 Pain in left foot: Secondary | ICD-10-CM

## 2011-08-25 ENCOUNTER — Telehealth: Payer: Self-pay | Admitting: *Deleted

## 2011-08-25 NOTE — Telephone Encounter (Signed)
Pt given foot xray results.

## 2011-10-10 ENCOUNTER — Ambulatory Visit (INDEPENDENT_AMBULATORY_CARE_PROVIDER_SITE_OTHER): Payer: BC Managed Care – PPO | Admitting: Internal Medicine

## 2011-10-10 ENCOUNTER — Other Ambulatory Visit: Payer: Self-pay | Admitting: *Deleted

## 2011-10-10 VITALS — BP 150/90 | HR 76 | Temp 98.3°F | Resp 16 | Ht 65.5 in | Wt 188.0 lb

## 2011-10-10 DIAGNOSIS — M79609 Pain in unspecified limb: Secondary | ICD-10-CM

## 2011-10-10 DIAGNOSIS — M722 Plantar fascial fibromatosis: Secondary | ICD-10-CM

## 2011-10-10 DIAGNOSIS — M79673 Pain in unspecified foot: Secondary | ICD-10-CM

## 2011-10-10 NOTE — Progress Notes (Signed)
  Subjective:    Patient ID: Rachael Munoz, female    DOB: 12-22-1960, 51 y.o.   MRN: [id number]  HPI Patient is a healthy 51 year old female on no current medications who presents to discuss foot pain.  She is pain but more foot from her heel to her toe on the plantar surface consistent with plantar fasciitis.  She presents to discuss options for therapy.   Review of Systems  Constitutional: Negative for activity change, appetite change and fatigue.  HENT: Negative for ear pain, congestion, neck pain, postnasal drip and sinus pressure.   Eyes: Negative for redness and visual disturbance.  Respiratory: Negative for cough, shortness of breath and wheezing.   Gastrointestinal: Negative for abdominal pain and abdominal distention.  Genitourinary: Negative for dysuria, frequency and menstrual problem.  Musculoskeletal: Negative for myalgias, joint swelling and arthralgias.  Skin: Negative for rash and wound.  Neurological: Negative for dizziness, weakness and headaches.  Hematological: Negative for adenopathy. Does not bruise/bleed easily.  Psychiatric/Behavioral: Negative for disturbed wake/sleep cycle and decreased concentration.       Objective:   Physical Exam  Constitutional: She appears well-developed and well-nourished.  HENT:  Head: Normocephalic and atraumatic.  Eyes: Conjunctivae normal are normal. Pupils are equal, round, and reactive to light.  Cardiovascular: Normal rate and regular rhythm.   Pulmonary/Chest: Effort normal and breath sounds normal.  Musculoskeletal:       Tenderness along ther fascia          Assessment & Plan:  Acute on chronic plantar fasciitis Discussed the use of stretching arch supports and shoes and the possibility of obtaining a foot brace to wear at night that stretches to put.  Discussion of podiatry has a preferred provider of therapy for the plantar fasciitis.

## 2011-12-20 ENCOUNTER — Other Ambulatory Visit: Payer: Self-pay | Admitting: Obstetrics & Gynecology

## 2011-12-20 DIAGNOSIS — Z1231 Encounter for screening mammogram for malignant neoplasm of breast: Secondary | ICD-10-CM

## 2012-01-31 ENCOUNTER — Encounter: Payer: Self-pay | Admitting: Internal Medicine

## 2012-01-31 NOTE — Patient Instructions (Signed)
Foot stretching exercises discussed

## 2012-02-13 ENCOUNTER — Ambulatory Visit
Admission: RE | Admit: 2012-02-13 | Discharge: 2012-02-13 | Disposition: A | Discharge status: Home / Self Care | Payer: BC Managed Care – PPO | Source: Ambulatory Visit | Attending: Obstetrics & Gynecology | Admitting: Obstetrics & Gynecology

## 2012-02-13 DIAGNOSIS — Z1231 Encounter for screening mammogram for malignant neoplasm of breast: Secondary | ICD-10-CM

## 2013-01-17 ENCOUNTER — Other Ambulatory Visit: Payer: Self-pay

## 2013-01-17 DIAGNOSIS — Z1231 Encounter for screening mammogram for malignant neoplasm of breast: Secondary | ICD-10-CM

## 2013-02-01 ENCOUNTER — Encounter: Payer: Self-pay | Admitting: Obstetrics & Gynecology

## 2013-02-13 ENCOUNTER — Ambulatory Visit
Admission: RE | Admit: 2013-02-13 | Discharge: 2013-02-13 | Disposition: A | Discharge status: Home / Self Care | Payer: BC Managed Care – PPO | Source: Ambulatory Visit

## 2013-02-13 DIAGNOSIS — Z1231 Encounter for screening mammogram for malignant neoplasm of breast: Secondary | ICD-10-CM

## 2013-07-31 ENCOUNTER — Ambulatory Visit: Payer: Self-pay | Admitting: Obstetrics & Gynecology

## 2013-08-01 ENCOUNTER — Encounter: Payer: Self-pay | Admitting: Obstetrics & Gynecology

## 2013-08-02 ENCOUNTER — Ambulatory Visit (INDEPENDENT_AMBULATORY_CARE_PROVIDER_SITE_OTHER): Payer: BC Managed Care – PPO | Admitting: Obstetrics & Gynecology

## 2013-08-02 ENCOUNTER — Encounter: Payer: Self-pay | Admitting: Obstetrics & Gynecology

## 2013-08-02 VITALS — BP 126/64 | HR 64 | Resp 16 | Ht 64.75 in | Wt 195.2 lb

## 2013-08-02 DIAGNOSIS — Z0000 Encounter for general adult medical examination without abnormal findings: Secondary | ICD-10-CM

## 2013-08-02 DIAGNOSIS — Z124 Encounter for screening for malignant neoplasm of cervix: Secondary | ICD-10-CM

## 2013-08-02 DIAGNOSIS — Z01419 Encounter for gynecological examination (general) (routine) without abnormal findings: Secondary | ICD-10-CM

## 2013-08-02 NOTE — Progress Notes (Signed)
53 y.o. G4P3 Legally SeparatedCaucasianF here for annual exam.  No vaginal bleeding.  No hot flashes.  No night sweats.  Donates blood every 8 weeks.    Patient's last menstrual period was 05/09/2006.          Sexually active: no  The current method of family planning is none.    Exercising: yes  walking Smoker:  no  Health Maintenance: Pap:  04/20/11 WNL/negative HR HPV History of abnormal Pap:  no MMG:  02/13/13-normal Colonoscopy:  2/11-repeat in 5 years, Dr. Wynetta Emery BMD:   none TDaP:  04/20/11 Screening Labs: 2012, Hb today: donates blood regularly, Urine today: WBC-+1, PROTEIN-trace   reports that she quit smoking about 27 years ago. She has never used smokeless tobacco. She reports that she drinks about 1.5 ounces of alcohol per week. She reports that she does not use illicit drugs.  Past Medical History  Diagnosis Date  . Hypothyroid   . Colon polyp 7/99    high grade dysplasia   . Ovarian cyst     right  . Anemia     mild-donates blood regularly    Past Surgical History  Procedure Laterality Date  . Colonoscopy w/ biopsies  7/99    high grade dysplasia  . Intrauterine device (iud) insertion  7/07  . Iud removal  12/12  . Dilation and curettage of uterus      Current Outpatient Prescriptions  Medication Sig Dispense Refill  . ALPRAZolam (XANAX) 0.25 MG tablet Take 0.25 mg by mouth at bedtime as needed for anxiety.       No current facility-administered medications for this visit.    Family History  Problem Relation Age of Onset  . CVA Mother   . Diabetes Paternal Grandmother   . Heart disease Maternal Grandfather   . Heart disease Paternal Grandfather     heart attack  . Tuberculosis Sister   . Colitis Father     ROS:  Pertinent items are noted in HPI.  Otherwise, a comprehensive ROS was negative.  Exam:   BP 126/64  Pulse 64  Resp 16  Ht 5' 4.75" (1.645 m)  Wt 195 lb 3.2 oz (88.542 kg)  BMI 32.72 kg/m2  LMP 05/09/2006  Weight change: +9lb    Height: 5' 4.75" (164.5 cm)  Ht Readings from Last 3 Encounters:  08/02/13 5' 4.75" (1.645 m)  10/10/11 5' 5.5" (1.664 m)  08/22/11 5\' 5"  (1.651 m)    General appearance: alert, cooperative and appears stated age Head: Normocephalic, without obvious abnormality, atraumatic Neck: no adenopathy, supple, symmetrical, trachea midline and thyroid normal to inspection and palpation Lungs: clear to auscultation bilaterally Breasts: normal appearance, no masses or tenderness Heart: regular rate and rhythm Abdomen: soft, non-tender; bowel sounds normal; no masses,  no organomegaly Extremities: extremities normal, atraumatic, no cyanosis or edema Skin: Skin color, texture, turgor normal. No rashes or lesions Lymph nodes: Cervical, supraclavicular, and axillary nodes normal. No abnormal inguinal nodes palpated Neurologic: Grossly normal   Pelvic: External genitalia:  no lesions              Urethra:  normal appearing urethra with no masses, tenderness or lesions              Bartholins and Skenes: normal                 Vagina: normal appearing vagina with normal color and discharge, no lesions  Cervix: no lesions              Pap taken: yes Bimanual Exam:  Uterus:  normal size, contour, position, consistency, mobility, non-tender              Adnexa: normal adnexa and no mass, fullness, tenderness               Rectovaginal: Confirms               Anus:  normal sphincter tone, no lesions  A:  Well Woman with normal exam PMP, no HRT Mildly elevated lipids.  Normal HDLs.  Ratio good.  P:   Mammogram yearly pap smear today return annually or prn  An After Visit Summary was printed and given to the patient.

## 2013-08-06 LAB — IPS PAP SMEAR ONLY

## 2013-12-04 ENCOUNTER — Other Ambulatory Visit: Payer: Self-pay

## 2013-12-04 DIAGNOSIS — Z1231 Encounter for screening mammogram for malignant neoplasm of breast: Secondary | ICD-10-CM

## 2014-02-14 ENCOUNTER — Ambulatory Visit
Admission: RE | Admit: 2014-02-14 | Discharge: 2014-02-14 | Disposition: A | Discharge status: Home / Self Care | Payer: BC Managed Care – PPO | Source: Ambulatory Visit

## 2014-02-14 DIAGNOSIS — Z1231 Encounter for screening mammogram for malignant neoplasm of breast: Secondary | ICD-10-CM

## 2014-03-10 ENCOUNTER — Encounter: Payer: Self-pay | Admitting: Obstetrics & Gynecology

## 2014-07-17 ENCOUNTER — Ambulatory Visit (INDEPENDENT_AMBULATORY_CARE_PROVIDER_SITE_OTHER): Payer: BC Managed Care – PPO | Admitting: Family Medicine

## 2014-07-17 ENCOUNTER — Encounter: Payer: Self-pay | Admitting: Family Medicine

## 2014-07-17 VITALS — BP 122/68 | HR 76 | Temp 98.1°F | Wt 190.0 lb

## 2014-07-17 DIAGNOSIS — Z860100 Personal history of colon polyps, unspecified: Secondary | ICD-10-CM | POA: Insufficient documentation

## 2014-07-17 DIAGNOSIS — E039 Hypothyroidism, unspecified: Secondary | ICD-10-CM | POA: Insufficient documentation

## 2014-07-17 DIAGNOSIS — D649 Anemia, unspecified: Secondary | ICD-10-CM | POA: Insufficient documentation

## 2014-07-17 DIAGNOSIS — E669 Obesity, unspecified: Secondary | ICD-10-CM

## 2014-07-17 NOTE — Assessment & Plan Note (Signed)
Counseling provided to help patient reach hopefully overweight status. She is doing a great job walking but really does not watch her foods. She thinks eating is important for QOL. I tried to gently nudge her to continue weight loss efforts with family history of diabetes.

## 2014-07-17 NOTE — Assessment & Plan Note (Signed)
Anemia related to giving blood- has iron supplements which she takes around the time of giving. Check CBC (through obgyn when they draw labs as declines today)

## 2014-07-17 NOTE — Progress Notes (Signed)
Rachael Reddish, MD Phone: [phone]  Subjective:  Patient presents today to establish care with me as their new primary care provider. Patient was formerly a patient of Dr. Arnoldo Morale. Chief complaint-noted.   Patient has done very well since we saw her last. She was told she was hypothyroid in past but only took low doses of levothyroxine and later taken off. Assume controlled. She has anemia from giving blood frequently but takes supplemental iron which seems to help. She has obesity but has started walking her dog 30 minutes a day and weight down 5 lbs. She does not want to stress about weight.   She sees ob/gyn next month and prefers to get labs at that location.   ROS-no chest pain, worsening fatigue, shortness of breath, DOE. No hair or nail changes. No heat/cold intolerance. No constipation or diarrhea. Denies shakiness or anxiety. Lost 5 lbs in 1 year intentionally. No palpitations. No chest pain, fatigue, shortness of breath.   The following were reviewed and entered/updated in epic: Past Medical History  Diagnosis Date  . Hypothyroid   . Colon polyp 7/99    high grade dysplasia   . Ovarian cyst     right  . Anemia     mild-donates blood regularly   Patient Active Problem List   Diagnosis Date Noted  . Obesity 07/17/2014    Priority: Medium  . History of colonic polyps 07/17/2014    Priority: Low  . Hypothyroidism 07/17/2014    Priority: Low  . Anemia 07/17/2014    Priority: Low   Past Surgical History  Procedure Laterality Date  . Colonoscopy w/ biopsies  7/99    high grade dysplasia  . Intrauterine device (iud) insertion  7/07  . Iud removal  12/12  . Dilation and curettage of uterus      miscarriage    Family History  Problem Relation Age of Onset  . CVA Mother     66s, smoking, alcohol  . Diabetes Paternal Grandmother   . Heart disease Maternal Grandfather   . Heart disease Paternal Grandfather     heart attack  . Tuberculosis Sister     unknown how  developed  . Colitis Father     ostomy bag  . Leukemia Father     into 58s   Medications- xanax for sleep in divorce  Allergies-reviewed and updated No Known Allergies  History   Social History  . Marital Status: Legally Separated    Spouse Name: N/A  . Number of Children: N/A  . Years of Education: N/A   Social History Main Topics  . Smoking status: Former Smoker -- 1.50 packs/day for 10 years    Types: Cigarettes    Quit date: 10/19/1985  . Smokeless tobacco: Never Used  . Alcohol Use: 1.8 - 2.4 oz/week    3-4 Standard drinks or equivalent per week  . Drug Use: No  . Sexual Activity: No   Other Topics Concern  . None   Social History Narrative   Divorced in 2012. 3 children. 2 grandkids.    Lives alone.       Works as Environmental manager at [employer]: vacation-lay out on beach    ROS--See HPI   Objective: BP 122/68 mmHg  Pulse 76  Temp(Src) 98.1 F (36.7 C)  Wt 190 lb (86.183 kg)  LMP 05/09/2006 Gen: NAD, resting comfortably HEENT: Mucous membranes are moist. Oropharynx normal CV: RRR no murmurs rubs or gallops Lungs:  CTAB no crackles, wheeze, rhonchi Abdomen: soft/nontender/nondistended/normal bowel sounds. No rebound or guarding.  Ext: no edema Skin: warm, dry, no rash Neuro: grossly normal, moves all extremities, PERRLA   Assessment/Plan:  Hypothyroidism On levothyroxine in past for unclear indication. i have asked patient to get a TSH through ob/gyn and have records sent to me (She declines labs in this office today)   Anemia Anemia related to giving blood- has iron supplements which she takes around the time of giving. Check CBC (through obgyn when they draw labs as declines today)   Obesity Counseling provided to help patient reach hopefully overweight status. She is doing a great job walking but really does not watch her foods. She thinks eating is important for QOL. I tried to gently nudge her to continue weight  loss efforts with family history of diabetes.    1 year follow up. She is to Fax copy Tdap, dermatologist name who she sees yearly, and labs that I requested.

## 2014-07-17 NOTE — Assessment & Plan Note (Signed)
On levothyroxine in past for unclear indication. i have asked patient to get a TSH through ob/gyn and have records sent to me (She declines labs in this office today)

## 2014-07-17 NOTE — Patient Instructions (Addendum)
I think you are doing a great job walking! You have dropped 5 lbs. Want you to continue this trend and hopefully target a weight closer to 180 eventually (but don't stress about this!).   When you see your ob/gyn, Please ask her to get a fasting blood sugar, kidney function, and liver function, thyroid, blood counts (CBC, CMET, TSH, lipids) and send me a copy. I will plan on calculating 10 year cardiac risk based on lipids.   Send [place] a copy of Tdap  Health Maintenance Due  Topic Date Due  . HIV Screening - dont need because you give blood 07/25/1973  . TETANUS/TDAP  07/25/1977   See you in a year

## 2014-08-21 ENCOUNTER — Ambulatory Visit (INDEPENDENT_AMBULATORY_CARE_PROVIDER_SITE_OTHER): Payer: BC Managed Care – PPO | Admitting: Obstetrics & Gynecology

## 2014-08-21 ENCOUNTER — Encounter: Payer: Self-pay | Admitting: Obstetrics & Gynecology

## 2014-08-21 VITALS — BP 120/82 | HR 68 | Resp 16 | Ht 65.0 in | Wt 185.6 lb

## 2014-08-21 DIAGNOSIS — Z0000 Encounter for general adult medical examination without abnormal findings: Secondary | ICD-10-CM

## 2014-08-21 DIAGNOSIS — Z01419 Encounter for gynecological examination (general) (routine) without abnormal findings: Secondary | ICD-10-CM

## 2014-08-21 LAB — BASIC METABOLIC PANEL
BUN: 20 mg/dL (ref 4–21)
Creatinine: 0.8 mg/dL (ref <=1.1)
GLUCOSE: 79 mg/dL
Potassium: 5.1 mmol/L (ref 3.4–5.3)
Sodium: 143 mmol/L (ref 137–147)

## 2014-08-21 LAB — POCT URINALYSIS DIPSTICK
BILIRUBIN UA: NEGATIVE
GLUCOSE UA: NEGATIVE
KETONES UA: NEGATIVE
Nitrite, UA: NEGATIVE
Protein, UA: NEGATIVE
RBC UA: NEGATIVE
UROBILINOGEN UA: NEGATIVE
pH, UA: 5

## 2014-08-21 LAB — CBC
HCT: 39 % (ref 36.0–46.0)
Hemoglobin: 12.1 g/dL (ref 12.0–15.0)
MCH: 26.4 pg (ref 26.0–34.0)
MCHC: 31 g/dL (ref 30.0–36.0)
MCV: 85 fL (ref 78.0–100.0)
MPV: 11.8 fL (ref 8.6–12.4)
PLATELETS: 286 10*3/uL (ref 150–400)
RBC: 4.59 MIL/uL (ref 3.87–5.11)
RDW: 16 % — AB (ref 11.5–15.5)
WBC: 5.2 10*3/uL (ref 4.0–10.5)

## 2014-08-21 LAB — CBC AND DIFFERENTIAL: WBC: 5.2 10^3/mL

## 2014-08-21 LAB — LIPID PANEL: Cholesterol: 235 mg/dL — AB (ref 0–200)

## 2014-08-21 LAB — TSH: TSH: 3.1 u[IU]/mL (ref <=5.90)

## 2014-08-21 NOTE — Progress Notes (Signed)
54 y.o. G4P3 Legally SeparatedCaucasianF here for annual exam.  Doing well.  No vaginal bleeding.  Did go to urgent care this year for allergies but is much better.    New PCP:  Dr. Yong Channel.  Saw him in March.   Patient's last menstrual period was 05/09/2006.          Sexually active: No.  The current method of family planning is post menopausal status.    Exercising: Yes.    walking-daily Smoker:  Former smoker years ago  Health Maintenance: Pap:  08/02/13 WNL.  Neg HR HPV 2012 History of abnormal Pap:  no MMG:  02/14/14-normal Colonoscopy:  12/11-repeat in 5 years in note from colonoscopy.  Pt reports Dr. Wynetta Emery (GI) told her it was 10 years.  Pt states she will check on this. BMD:   none TDaP:  04/20/11 Screening Labs: today, Hb today: donates blood, Urine today: WBC-trace   reports that she quit smoking about 28 years ago. Her smoking use included Cigarettes. She has a 15 pack-year smoking history. She has never used smokeless tobacco. She reports that she drinks about 1.2 - 1.8 oz of alcohol per week. She reports that she does not use illicit drugs.  Past Medical History  Diagnosis Date  . Hypothyroid   . Colon polyp 7/99    high grade dysplasia   . Ovarian cyst     right  . Anemia     mild-donates blood regularly  . Seasonal allergies     Past Surgical History  Procedure Laterality Date  . Colonoscopy w/ biopsies  7/99    high grade dysplasia  . Intrauterine device (iud) insertion  7/07  . Iud removal  12/12  . Dilation and curettage of uterus      miscarriage    Current Outpatient Prescriptions  Medication Sig Dispense Refill  . benzonatate (TESSALON) 100 MG capsule   0  . fexofenadine (ALLEGRA) 60 MG tablet Take 60 mg by mouth 2 (two) times daily.    . fluticasone (FLONASE) 50 MCG/ACT nasal spray Place into both nostrils daily.    . Multiple Vitamins-Minerals (MULTIVITAMIN ADULT PO) Take by mouth.    . Probiotic Product (PROBIOTIC PO) Take by mouth.     No  current facility-administered medications for this visit.    Family History  Problem Relation Age of Onset  . CVA Mother     18s, smoking, alcohol  . Diabetes Paternal Grandmother   . Heart disease Maternal Grandfather   . Heart disease Paternal Grandfather     heart attack  . Tuberculosis Sister     unknown how developed  . Colitis Father     ostomy bag  . Leukemia Father     into 87s    ROS:  Pertinent items are noted in HPI.  Otherwise, a comprehensive ROS was negative.  Exam:   BP 120/82 mmHg  Pulse 68  Resp 16  Ht 5\' 5"  (1.651 m)  Wt 185 lb 9.6 oz (84.188 kg)  BMI 30.89 kg/m2  LMP 05/09/2006   Height: 5\' 5"  (165.1 cm)  Ht Readings from Last 3 Encounters:  08/21/14 5\' 5"  (1.651 m)  08/02/13 5' 4.75" (1.645 m)  10/10/11 5' 5.5" (1.664 m)    General appearance: alert, cooperative and appears stated age Head: Normocephalic, without obvious abnormality, atraumatic Neck: no adenopathy, supple, symmetrical, trachea midline and thyroid normal to inspection and palpation Lungs: clear to auscultation bilaterally Breasts: normal appearance, no masses or tenderness Heart: regular  rate and rhythm Abdomen: soft, non-tender; bowel sounds normal; no masses,  no organomegaly Extremities: extremities normal, atraumatic, no cyanosis or edema Skin: Skin color, texture, turgor normal. No rashes or lesions Lymph nodes: Cervical, supraclavicular, and axillary nodes normal. No abnormal inguinal nodes palpated Neurologic: Grossly normal   Pelvic: External genitalia:  no lesions              Urethra:  normal appearing urethra with no masses, tenderness or lesions              Bartholins and Skenes: normal                 Vagina: normal appearing vagina with normal color and discharge, no lesions              Cervix: no lesions              Pap taken: No. Bimanual Exam:  Uterus:  normal size, contour, position, consistency, mobility, non-tender              Adnexa: normal adnexa and  no mass, fullness, tenderness               Rectovaginal: Confirms               Anus:  normal sphincter tone, no lesions  Chaperone was present for exam.  A:  Well Woman with normal exam PMP, no HRT Family hx of colon or rectal cancer with father Former smoker Increased allergy issues this year  P: Mammogram yearly pap smear 2015.  Neg HR HPV 2012. CMP, TSH, Vit D, CBC, and Lipids today.  Pt is fasting.  Pt will check on colonoscopy as my notes indicated due around 12/16.  Pt feels like it is 10 years.  return annually or prn

## 2014-08-22 LAB — COMPREHENSIVE METABOLIC PANEL
ALK PHOS: 54 U/L (ref 39–117)
ALT: 8 U/L (ref 0–35)
AST: 16 U/L (ref 0–37)
Albumin: 4.3 g/dL (ref 3.5–5.2)
BILIRUBIN TOTAL: 0.5 mg/dL (ref 0.2–1.2)
BUN: 20 mg/dL (ref 6–23)
CO2: 27 meq/L (ref 19–32)
CREATININE: 0.75 mg/dL (ref 0.50–1.10)
Calcium: 10.1 mg/dL (ref 8.4–10.5)
Chloride: 105 mEq/L (ref 96–112)
Glucose, Bld: 79 mg/dL (ref 70–99)
Potassium: 5.1 mEq/L (ref 3.5–5.3)
SODIUM: 143 meq/L (ref 135–145)
TOTAL PROTEIN: 7 g/dL (ref 6.0–8.3)

## 2014-08-22 LAB — LIPID PANEL
CHOL/HDL RATIO: 4 ratio
CHOLESTEROL: 235 mg/dL — AB (ref 0–200)
HDL: 59 mg/dL (ref >=46)
LDL Cholesterol: 162 mg/dL — ABNORMAL HIGH (ref 0–99)
Triglycerides: 70 mg/dL (ref <150)
VLDL: 14 mg/dL (ref 0–40)

## 2014-08-22 LAB — TSH: TSH: 3.102 u[IU]/mL (ref 0.350–4.500)

## 2014-08-22 LAB — VITAMIN D 25 HYDROXY (VIT D DEFICIENCY, FRACTURES): Vit D, 25-Hydroxy: 32 ng/mL (ref 30–100)

## 2014-09-04 ENCOUNTER — Encounter: Payer: Self-pay | Admitting: Family Medicine

## 2014-11-13 ENCOUNTER — Other Ambulatory Visit: Payer: Self-pay

## 2014-11-13 DIAGNOSIS — Z1231 Encounter for screening mammogram for malignant neoplasm of breast: Secondary | ICD-10-CM

## 2015-02-18 ENCOUNTER — Ambulatory Visit
Admission: RE | Admit: 2015-02-18 | Discharge: 2015-02-18 | Disposition: A | Discharge status: Home / Self Care | Payer: BC Managed Care – PPO | Source: Ambulatory Visit

## 2015-02-18 DIAGNOSIS — Z1231 Encounter for screening mammogram for malignant neoplasm of breast: Secondary | ICD-10-CM

## 2015-11-06 ENCOUNTER — Telehealth: Payer: Self-pay | Admitting: Obstetrics & Gynecology

## 2015-11-06 ENCOUNTER — Encounter: Payer: Self-pay | Admitting: Obstetrics & Gynecology

## 2015-11-06 ENCOUNTER — Ambulatory Visit (INDEPENDENT_AMBULATORY_CARE_PROVIDER_SITE_OTHER): Payer: BC Managed Care – PPO | Admitting: Obstetrics & Gynecology

## 2015-11-06 VITALS — BP 120/70 | HR 72 | Resp 16 | Ht 64.5 in | Wt 189.0 lb

## 2015-11-06 DIAGNOSIS — Z0000 Encounter for general adult medical examination without abnormal findings: Secondary | ICD-10-CM | POA: Diagnosis not present

## 2015-11-06 DIAGNOSIS — Z124 Encounter for screening for malignant neoplasm of cervix: Secondary | ICD-10-CM

## 2015-11-06 DIAGNOSIS — Z01419 Encounter for gynecological examination (general) (routine) without abnormal findings: Secondary | ICD-10-CM | POA: Diagnosis not present

## 2015-11-06 LAB — POCT URINALYSIS DIPSTICK
BILIRUBIN UA: NEGATIVE
Blood, UA: NEGATIVE
Glucose, UA: NEGATIVE
KETONES UA: NEGATIVE
Leukocytes, UA: NEGATIVE
Nitrite, UA: NEGATIVE
Protein, UA: NEGATIVE
Urobilinogen, UA: NEGATIVE
pH, UA: 5

## 2015-11-06 MED ORDER — TRIAMCINOLONE ACETONIDE 0.5 % EX OINT: 1.0000 "application " | TOPICAL_OINTMENT | Freq: Two times a day (BID) | CUTANEOUS | Status: DC

## 2015-11-06 NOTE — Addendum Note (Signed)
Addended by: Susy Manor on: 11/06/2015 12:03 PM   Modules accepted: Miquel Dunn

## 2015-11-06 NOTE — Addendum Note (Signed)
Addended by: Susy Manor on: 11/06/2015 11:54 AM   Modules accepted: Miquel Dunn

## 2015-11-06 NOTE — Patient Instructions (Signed)
I will do fasting lab work on you next year.

## 2015-11-06 NOTE — Progress Notes (Signed)
55 y.o. G4P3 Divorced Caucasian Fe here for annual exam.  Doing well.  No vaginal bleeding.  Busy with grand kids.    Has poison ivy.  Very itchy.    PCP:  Dr. Yong Channel.  Was seen last year.     Patient's last menstrual period was 05/09/2006.          Sexually active: No.  The current method of family planning is tubal ligation.    Exercising: Yes.    walking Smoker:  no  Health Maintenance: Pap:  08-02-13 neg  MMG: 02-18-15 category b density birads 1:neg Colonoscopy:  2011 f/u 36yrs, scheduled  For 12-15-15.  Has seen Dr Wynetta Emery. BMD:   none TDaP:  2012 Shingles vaccine: no Pneumonia vaccine: no Hep C and HIV: Gives blood every eight weeks Labs: poct urine-neg   reports that she quit smoking about 30 years ago. Her smoking use included Cigarettes. She has a 15 pack-year smoking history. She has never used smokeless tobacco. She reports that she drinks about 1.2 - 1.8 oz of alcohol per week. She reports that she does not use illicit drugs.  Past Medical History  Diagnosis Date  . Hypothyroid   . Colon polyp 7/99    high grade dysplasia   . Ovarian cyst     right  . Anemia     mild-donates blood regularly  . Seasonal allergies     Past Surgical History  Procedure Laterality Date  . Colonoscopy w/ biopsies  7/99    high grade dysplasia  . Intrauterine device (iud) insertion  7/07  . Iud removal  12/12  . Dilation and curettage of uterus      miscarriage  . Tubal ligation      Current Outpatient Prescriptions  Medication Sig Dispense Refill  . fexofenadine (ALLEGRA) 60 MG tablet Take 60 mg by mouth 2 (two) times daily.    . fluticasone (FLONASE) 50 MCG/ACT nasal spray Place into both nostrils daily.    . Multiple Vitamins-Minerals (MULTIVITAMIN ADULT PO) Take by mouth.     No current facility-administered medications for this visit.    Family History  Problem Relation Age of Onset  . CVA Mother     35s, smoking, alcohol  . Diabetes Paternal Grandmother   . Heart  disease Maternal Grandfather   . Heart disease Paternal Grandfather     heart attack  . Tuberculosis Sister     unknown how developed  . Colitis Father     ostomy bag  . Leukemia Father     into 21s    ROS:  Pertinent items are noted in HPI.  Otherwise, a comprehensive ROS was negative.  Exam:   BP 120/70 mmHg  Pulse 72  Resp 16  Ht 5' 4.5" (1.638 m)  Wt 189 lb (85.73 kg)  BMI 31.95 kg/m2  LMP 05/09/2006 Height: 5' 4.5" (163.8 cm) Ht Readings from Last 3 Encounters:  11/06/15 5' 4.5" (1.638 m)  08/21/14 5\' 5"  (1.651 m)  08/02/13 5' 4.75" (1.645 m)   General appearance: alert, cooperative and appears stated age Head: Normocephalic, without obvious abnormality, atraumatic Neck: no adenopathy, supple, symmetrical, trachea midline and thyroid normal to inspection and palpation Lungs: clear to auscultation bilaterally Breasts: normal appearance, no masses or tenderness Heart: regular rate and rhythm Abdomen: soft, non-tender; no masses,  no organomegaly Extremities: extremities normal, atraumatic, no cyanosis or edema Skin: Skin color, texture, turgor normal. No rashes or lesions Lymph nodes: Cervical, supraclavicular, and axillary nodes normal.  No abnormal inguinal nodes palpated Neurologic: Grossly normal   Pelvic: External genitalia:  no lesions              Urethra:  normal appearing urethra with no masses, tenderness or lesions              Bartholin's and Skene's: normal                 Vagina: normal appearing vagina with normal color and discharge, no lesions              Cervix: no lesions              Pap taken: Yes.   Bimanual Exam:  Uterus:  normal size, contour, position, consistency, mobility, non-tender              Adnexa: normal adnexa and no mass, fullness, tenderness               Rectovaginal: Confirms               Anus:  normal sphincter tone, no lesions  Chaperone present: yes  A:  Well Woman with normal exam PMP, no HRT Family hx of colon or  rectal cancer with father Former smoker Poison ivy  P: Mammogram yearly pap smear 2015. Neg HR HPV 2012. Plan fasting labs next year Colonoscopy is scheduled Kenalog 0.5% cream bid until itching gone.  #30gm/1RF return annually or prn

## 2015-11-12 LAB — IPS PAP TEST WITH HPV

## 2015-12-10 NOTE — Telephone Encounter (Signed)
Erroneous encounter

## 2015-12-14 ENCOUNTER — Other Ambulatory Visit: Payer: Self-pay | Admitting: Obstetrics & Gynecology

## 2015-12-14 DIAGNOSIS — Z1231 Encounter for screening mammogram for malignant neoplasm of breast: Secondary | ICD-10-CM

## 2015-12-15 ENCOUNTER — Ambulatory Visit (HOSPITAL_COMMUNITY): Payer: BC Managed Care – PPO | Admitting: Certified Registered Nurse Anesthetist

## 2015-12-15 ENCOUNTER — Encounter (HOSPITAL_COMMUNITY): Payer: Self-pay

## 2015-12-15 ENCOUNTER — Ambulatory Visit (HOSPITAL_COMMUNITY)
Admission: RE | Admit: 2015-12-15 | Discharge: 2015-12-15 | Disposition: A | Discharge status: Home / Self Care | Payer: BC Managed Care – PPO | Source: Ambulatory Visit | Attending: Gastroenterology | Admitting: Gastroenterology

## 2015-12-15 ENCOUNTER — Encounter (HOSPITAL_COMMUNITY)
Admission: RE | Disposition: A | Discharge status: Home / Self Care | Payer: Self-pay | Source: Ambulatory Visit | Attending: Gastroenterology

## 2015-12-15 DIAGNOSIS — Z800 Family history of malignant neoplasm of digestive organs: Secondary | ICD-10-CM | POA: Diagnosis not present

## 2015-12-15 DIAGNOSIS — Z86010 Personal history of colonic polyps: Secondary | ICD-10-CM | POA: Diagnosis not present

## 2015-12-15 DIAGNOSIS — Z87891 Personal history of nicotine dependence: Secondary | ICD-10-CM | POA: Insufficient documentation

## 2015-12-15 DIAGNOSIS — Z79899 Other long term (current) drug therapy: Secondary | ICD-10-CM | POA: Insufficient documentation

## 2015-12-15 DIAGNOSIS — Z7951 Long term (current) use of inhaled steroids: Secondary | ICD-10-CM | POA: Insufficient documentation

## 2015-12-15 DIAGNOSIS — Z1211 Encounter for screening for malignant neoplasm of colon: Secondary | ICD-10-CM | POA: Insufficient documentation

## 2015-12-15 HISTORY — PX: COLONOSCOPY WITH PROPOFOL: SHX5780

## 2015-12-15 SURGERY — COLONOSCOPY WITH PROPOFOL
Anesthesia: Monitor Anesthesia Care

## 2015-12-15 MED ORDER — FENTANYL CITRATE (PF) 100 MCG/2ML IJ SOLN: 25.0000 ug | INTRAMUSCULAR | Status: DC | PRN

## 2015-12-15 MED ORDER — LIDOCAINE HCL (CARDIAC) 20 MG/ML IV SOLN
INTRAVENOUS | Status: AC
  Filled 2015-12-15: qty 5

## 2015-12-15 MED ORDER — ONDANSETRON HCL 4 MG/2ML IJ SOLN
INTRAMUSCULAR | Status: AC
  Filled 2015-12-15: qty 2

## 2015-12-15 MED ORDER — ONDANSETRON HCL 4 MG/2ML IJ SOLN
INTRAMUSCULAR | Status: DC | PRN
  Administered 2015-12-15: 4 mg via INTRAVENOUS

## 2015-12-15 MED ORDER — PROPOFOL 10 MG/ML IV BOLUS
INTRAVENOUS | Status: AC
  Filled 2015-12-15: qty 60

## 2015-12-15 MED ORDER — PROPOFOL 500 MG/50ML IV EMUL
INTRAVENOUS | Status: DC | PRN
  Administered 2015-12-15: 100 ug/kg/min via INTRAVENOUS

## 2015-12-15 MED ORDER — LACTATED RINGERS IV SOLN
INTRAVENOUS | Status: DC | PRN
  Administered 2015-12-15: 08:00:00 via INTRAVENOUS

## 2015-12-15 MED ORDER — LIDOCAINE HCL (CARDIAC) 20 MG/ML IV SOLN
INTRAVENOUS | Status: DC | PRN
  Administered 2015-12-15: 100 mg via INTRAVENOUS

## 2015-12-15 MED ORDER — PROPOFOL 10 MG/ML IV BOLUS
INTRAVENOUS | Status: DC | PRN
  Administered 2015-12-15: 20 mg via INTRAVENOUS
  Administered 2015-12-15 (×2): 10 mg via INTRAVENOUS
  Administered 2015-12-15: 20 mg via INTRAVENOUS

## 2015-12-15 MED ORDER — PROMETHAZINE HCL 25 MG/ML IJ SOLN: 12.5000 mg | Freq: Once | INTRAMUSCULAR | Status: DC | PRN

## 2015-12-15 SURGICAL SUPPLY — 22 items

## 2015-12-15 NOTE — H&P (Signed)
  Procedure: Surveillance colonoscopy. 07/01/2009 normal surveillance colonoscopy was performed. History of adenomatous colon polyps removed colonoscopically in the past. Father was diagnosed with colon cancer. August 1999 colonoscopy with removal of a 2 cm distal rectal tubulovillous adenomatous polyp was performed.  History: The patient is a 55 year old female born Feb 02, 1961. She is scheduled to undergo a surveillance colonoscopy today.  Medication allergies: None  Family history: Father was diagnosed with colon cancer  Past medical history: Denies chronic medical problems.  Exam: The patient is alert and lying comfortably on the endoscopy stretcher. Abdomen is soft and nontender to palpation. Lungs are clear to auscultation. Cardiac exam reveals a regular rhythm.  Plan: Proceed with surveillance colonoscopy

## 2015-12-15 NOTE — Anesthesia Postprocedure Evaluation (Signed)
Anesthesia Post Note  Patient: Rachael Munoz  Procedure(s) Performed: Procedure(s) (LRB): COLONOSCOPY WITH PROPOFOL (N/A)  Patient location during evaluation: PACU Anesthesia Type: MAC Level of consciousness: awake and alert Pain management: pain level controlled Vital Signs Assessment: post-procedure vital signs reviewed and stable Respiratory status: spontaneous breathing, nonlabored ventilation and respiratory function stable Cardiovascular status: stable and blood pressure returned to baseline Anesthetic complications: no    Last Vitals:  Vitals:   12/15/15 0810 12/15/15 0830  BP: (!) 110/93 129/86  Pulse: 65   Resp: 16   Temp: 36.4 C     Last Pain:  Vitals:   12/15/15 0810  TempSrc: Oral                 Nilda Simmer

## 2015-12-15 NOTE — Anesthesia Preprocedure Evaluation (Addendum)
Anesthesia Evaluation  Patient identified by MRN, date of birth, ID band Patient awake    Reviewed: Allergy & Precautions, NPO status , Patient's Chart, lab work & pertinent test results  History of Anesthesia Complications Negative for: history of anesthetic complications  Airway Mallampati: II  TM Distance: >3 FB Neck ROM: Full    Dental  (+) Teeth Intact, Dental Advisory Given   Pulmonary neg shortness of breath, neg sleep apnea, neg COPD, neg recent URI, former smoker,    Pulmonary exam normal breath sounds clear to auscultation       Cardiovascular negative cardio ROS   Rhythm:Regular Rate:Normal     Neuro/Psych negative neurological ROS     GI/Hepatic Neg liver ROS, Bowel prep,neg GERD  ,  Endo/Other  neg diabetesHypothyroidism   Renal/GU negative Renal ROS     Musculoskeletal   Abdominal   Peds  Hematology  (+) Blood dyscrasia (mild - donates blood regularly), anemia ,   Anesthesia Other Findings   Reproductive/Obstetrics                            Anesthesia Physical Anesthesia Plan  ASA: II  Anesthesia Plan: MAC   Post-op Pain Management:    Induction: Intravenous  Airway Management Planned: Natural Airway and Nasal Cannula  Additional Equipment:   Intra-op Plan:   Post-operative Plan:   Informed Consent: I have reviewed the patients History and Physical, chart, labs and discussed the procedure including the risks, benefits and alternatives for the proposed anesthesia with the patient or authorized representative who has indicated his/her understanding and acceptance.   Dental advisory given  Plan Discussed with:   Anesthesia Plan Comments:        Anesthesia Quick Evaluation

## 2015-12-15 NOTE — Op Note (Signed)
[facility] Patient Name: Rachael Munoz Procedure Date: 12/15/2015 MRN: [id number] Attending MD: Garlan Fair , MD Date of Birth: 1960-12-11 CSN: [id number] Age: 55 Admit Type: Outpatient Procedure:                Colonoscopy Indications:              High risk colon cancer surveillance: Personal                            history of adenoma with villous component Providers:                Garlan Fair, MD, Laverta Baltimore RN, RN,                            Despina Pole Tech, Technician, Adair Laundry, CRNA Referring MD:              Medicines:                Propofol per Anesthesia Complications:            No immediate complications. Estimated Blood Loss:     Estimated blood loss: none. Procedure:                Pre-Anesthesia Assessment:                           - Prior to the procedure, a History and Physical                            was performed, and patient medications and                            allergies were reviewed. The patient's tolerance of                            previous anesthesia was also reviewed. The risks                            and benefits of the procedure and the sedation                            options and risks were discussed with the patient.                            All questions were answered, and informed consent                            was obtained. Prior Anticoagulants: The patient has                            taken no previous anticoagulant or antiplatelet                            agents. ASA Grade Assessment: II - A patient with  mild systemic disease. After reviewing the risks                            and benefits, the patient was deemed in                            satisfactory condition to undergo the procedure.                           After obtaining informed consent, the colonoscope                            was passed under direct vision. Throughout the                 procedure, the patient's blood pressure, pulse, and                            oxygen saturations were monitored continuously. The                            [id number]) scope was introduced through                            the anus and advanced to the the cecum, identified                            by appendiceal orifice and ileocecal valve. The                            colonoscopy was performed without difficulty. The                            patient tolerated the procedure well. The quality                            of the bowel preparation was good. The terminal                            ileum, the ileocecal valve, the appendiceal orifice                            and the rectum were photographed. Scope In: 7:45:30 AM Scope Out: 8:01:12 AM Scope Withdrawal Time: 0 hours 9 minutes 28 seconds  Total Procedure Duration: 0 hours 15 minutes 42 seconds  Findings:      The perianal and digital rectal examinations were normal.      The entire examined colon appeared normal. Impression:               - The entire examined colon is normal.                           - No specimens collected. Moderate Sedation:      N/A- Per Anesthesia Care Recommendation:           - Patient has a contact number  available for                            emergencies. The signs and symptoms of potential                            delayed complications were discussed with the                            patient. Return to normal activities tomorrow.                            Written discharge instructions were provided to the                            patient.                           - Repeat colonoscopy in 5 years for surveillance.                           - Resume previous diet.                           - Continue present medications. Procedure Code(s):        --- Professional ---                           [id number], Colorectal cancer screening; colonoscopy on                             individual at high risk Diagnosis Code(s):        --- Professional ---                           Z86.010, Personal history of colonic polyps CPT copyright 2016 American Medical Association. All rights reserved. The codes documented in this report are preliminary and upon coder review may  be revised to meet current compliance requirements. Earle Gell, MD Garlan Fair, MD 12/15/2015 8:08:31 AM This report has been signed electronically. Number of Addenda: 0

## 2015-12-15 NOTE — Discharge Instructions (Signed)

## 2015-12-15 NOTE — Transfer of Care (Signed)
Immediate Anesthesia Transfer of Care Note  Patient: Rachael Munoz  Procedure(s) Performed: Procedure(s): COLONOSCOPY WITH PROPOFOL (N/A)  Patient Location: ENDO  Anesthesia Type:MAC  Level of Consciousness:  sedated, patient cooperative and responds to stimulation  Airway & Oxygen Therapy:Patient Spontanous Breathing and Patient connected to face mask oxgen  Post-op Assessment:  Report given to ENDO RN and Post -op Vital signs reviewed and stable  Post vital signs:  Reviewed and stable  Last Vitals:  Vitals:   12/15/15 0724  BP: (!) 141/89  Pulse: 67  Resp: 12  Temp: 123XX123 C    Complications: No apparent anesthesia complications

## 2015-12-16 ENCOUNTER — Encounter (HOSPITAL_COMMUNITY): Payer: Self-pay | Admitting: Gastroenterology

## 2016-02-19 ENCOUNTER — Ambulatory Visit
Admission: RE | Admit: 2016-02-19 | Discharge: 2016-02-19 | Disposition: A | Discharge status: Home / Self Care | Payer: BC Managed Care – PPO | Source: Ambulatory Visit | Attending: Obstetrics & Gynecology | Admitting: Obstetrics & Gynecology

## 2016-02-19 DIAGNOSIS — Z1231 Encounter for screening mammogram for malignant neoplasm of breast: Secondary | ICD-10-CM

## 2017-01-06 ENCOUNTER — Ambulatory Visit (INDEPENDENT_AMBULATORY_CARE_PROVIDER_SITE_OTHER): Payer: Worker's Compensation

## 2017-01-06 ENCOUNTER — Encounter (HOSPITAL_COMMUNITY): Payer: Self-pay | Admitting: *Deleted

## 2017-01-06 ENCOUNTER — Ambulatory Visit (HOSPITAL_COMMUNITY)
Admission: EM | Admit: 2017-01-06 | Discharge: 2017-01-06 | Disposition: A | Discharge status: Home / Self Care | Payer: Worker's Compensation | Attending: Physician Assistant | Admitting: Physician Assistant

## 2017-01-06 DIAGNOSIS — M25522 Pain in left elbow: Secondary | ICD-10-CM

## 2017-01-06 DIAGNOSIS — S52135A Nondisplaced fracture of neck of left radius, initial encounter for closed fracture: Secondary | ICD-10-CM

## 2017-01-06 DIAGNOSIS — W010XXA Fall on same level from slipping, tripping and stumbling without subsequent striking against object, initial encounter: Secondary | ICD-10-CM

## 2017-01-06 MED ORDER — NAPROXEN 500 MG PO TABS: 500.0000 mg | ORAL_TABLET | Freq: Two times a day (BID) | ORAL | 0 refills | Status: DC

## 2017-01-06 NOTE — Progress Notes (Signed)
Orthopedic Tech Progress Note Patient Details:  Rachael Munoz 1960-11-26 [id number]  Ortho Devices Type of Ortho Device: Long arm splint Ortho Device/Splint Location: applied long arm splint to pt left arm. pt tolerated application very well.  provide arm sling to left arm for support.  left arm. Ortho Device/Splint Interventions: Application, Adjustment   Kristopher Oppenheim 01/06/2017, 10:43 PM

## 2017-01-06 NOTE — ED Triage Notes (Signed)
Patient reports slipping on wet floor at work today, landed on left elbow. Patient reports pain with movement of left elbow. CMS intact distally to injury.

## 2017-01-06 NOTE — ED Notes (Signed)
Ortho  Tech  Called  Again  For  Update   He is  In  Procedure    In  [facility]   And  Will be  Here  When he  Can      Pt  Notified   Provider  Notified

## 2017-01-06 NOTE — ED Notes (Signed)
Pt  Advised   That the  Ortho  Tech   Was  Notified   And  It  Will   And  He  Will be  Here  When he  Can      Pt thanked  Elrod

## 2017-01-06 NOTE — Discharge Instructions (Signed)
Start Naproxen as directed. Follow up with orthopedics as scheduled. If noticing increased pain, tightness around splint, with numbness/tingling of fingers, changes in finger color, swelling of the fingers, follow up here or at the emergency department for reevaluation.

## 2017-01-06 NOTE — ED Provider Notes (Signed)
[facility]    CSN: [id number] Arrival date & time: 01/06/17  2751     History   Chief Complaint Chief Complaint  Patient presents with  . Fall  . Elbow Pain    HPI Rachael Munoz is a 56 y.o. female.   56 year old female comes in for left elbow pain after falling. She works at [employer], and was at work when this happened. States she slipped on wet floor, and fell forward with arms outstretched. She landed on her knees. Denies head injury, loss of consciousness. She went home, took 2 Aleve, and started ice compress. The pain worsened with throbbing, so she came in for evaluation. Denies numbness, tingling. Denies up and wound. States no noticeable swelling.      Past Medical History:  Diagnosis Date  . Anemia    mild-donates blood regularly  . 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 Hypothyroid   . Ovarian cyst    right  . Seasonal allergies     Patient Active Problem List   Diagnosis Date Noted  . History of colonic polyps 07/17/2014  . Hypothyroidism 07/17/2014  . Anemia 07/17/2014  . Obesity 07/17/2014    Past Surgical History:  Procedure Laterality Date  . COLONOSCOPY W/ BIOPSIES  7/99   high grade dysplasia  . COLONOSCOPY WITH PROPOFOL N/A 12/15/2015   Procedure: COLONOSCOPY WITH PROPOFOL;  Surgeon: Garlan Fair, MD;  Location: [facility] ENDOSCOPY;  Service: Endoscopy;  Laterality: N/A;  . DILATION AND CURETTAGE OF UTERUS     miscarriage  . INTRAUTERINE DEVICE (IUD) INSERTION  7/07  . IUD REMOVAL  12/12  . TUBAL LIGATION      OB History    Gravida Para Term Preterm AB Living   4 3     1 3    SAB TAB Ectopic Multiple Live Births   1               Home Medications    Prior to Admission medications   Medication Sig Start Date End Date Taking? Authorizing Provider  fexofenadine (ALLEGRA) 60 MG tablet Take 60 mg by mouth 2 (two) times daily.    [provider]  fluticasone (FLONASE) 50 MCG/ACT nasal spray Place  into both nostrils daily.    [provider]  Multiple Vitamins-Minerals (MULTIVITAMIN ADULT PO) Take by mouth.    [provider]  naproxen (NAPROSYN) 500 MG tablet Take 1 tablet (500 mg total) by mouth 2 (two) times daily. 01/06/17   Tasia Catchings, Amy V, PA-C  triamcinolone ointment (KENALOG) 0.5 % Apply 1 application topically 2 (two) times daily. 11/06/15   Megan Salon, MD    Family History Family History  Problem Relation Age of Onset  . CVA Mother        64s, smoking, alcohol  . Colitis Father        ostomy bag  . Leukemia Father        into 29s  . Diabetes Paternal Grandmother   . Heart disease Maternal Grandfather   . Heart disease Paternal Grandfather        heart attack  . Tuberculosis Sister        unknown how developed    Social History Social History  Substance Use Topics  . Smoking status: Former Smoker    Packs/day: 1.50    Years: 10.00    Types: Cigarettes    Quit date: 10/19/1985  . Smokeless tobacco: Never Used  .  Alcohol use 1.2 - 1.8 oz/week    2 - 3 Standard drinks or equivalent per week     Allergies   Patient has no known allergies.   Review of Systems Review of Systems  Reason unable to perform ROS: See HPI as above.     Physical Exam Triage Vital Signs ED Triage Vitals  Enc Vitals Group     BP 01/06/17 1942 (!) 152/78     Pulse Rate 01/06/17 1942 87     Resp 01/06/17 1942 18     Temp 01/06/17 1942 98 F (36.7 C)     Temp Source 01/06/17 1942 Oral     SpO2 01/06/17 1942 100 %     Weight --      Height --      Head Circumference --      Peak Flow --      Pain Score 01/06/17 1943 8     Pain Loc --      Pain Edu? --      Excl. in [place]? --    No data found.   Updated Vital Signs BP (!) 152/78 (BP Location: Right Arm)   Pulse 87   Temp 98 F (36.7 C) (Oral)   Resp 18   LMP 05/09/2006   SpO2 100%    Physical Exam  Constitutional: She is oriented to person, place, and time. She appears well-developed and  well-nourished. No distress.  HENT:  Head: Normocephalic and atraumatic.  Eyes: Pupils are equal, round, and reactive to light. Conjunctivae are normal.  Cardiovascular: Normal rate, regular rhythm and normal heart sounds.  Exam reveals no gallop and no friction rub.   No murmur heard. Pulmonary/Chest: Effort normal and breath sounds normal. She has no wheezes. She has no rales.  Musculoskeletal:  No tenderness on palpation of bilateral shoulders. Tenderness on palpation of left elbow, max tenderness at the olecranon process. Tenderness on palpation of wrists, ulnar greater than radial. No tenderness on palpation of metacarpals. Full range of motion of shoulder, wrist, fingers. Decreased flexion and extension of left elbow. Strength defered due to pain. Sensation intact and equal bilaterally.  Radial pulses 2+ and equal bilaterally. Capillary refill unable to assess due to an polish.  Neurological: She is alert and oriented to person, place, and time.  Skin: Skin is warm and dry.     UC Treatments / Results  Labs (all labs ordered are listed, but only abnormal results are displayed) Labs Reviewed - No data to display  EKG  EKG Interpretation None       Radiology Dg Elbow Complete Left  Result Date: 01/06/2017 CLINICAL DATA:  Patient fell this afternoon at school. Left elbow pain. EXAM: LEFT ELBOW - COMPLETE 3+ VIEW COMPARISON:  None. FINDINGS: There is an acute fracture of the radial neck without significant displacement, best seen along the radial aspect. Positive joint effusion with elevated fat pads. No dislocations. IMPRESSION: Acute, nondisplaced left radial neck fracture. Small joint effusion. Electronically Signed   By: Ashley Royalty M.D.   On: 01/06/2017 20:19    Procedures Procedures (including critical care time)  Medications Ordered in UC Medications - No data to display   Initial Impression / Assessment and Plan / UC Course  I have reviewed the triage vital signs  and the nursing notes.  Pertinent labs & imaging results that were available during my care of the patient were reviewed by me and considered in my medical decision making (see chart for details).  Clinical Course as of Jan 06 2122  Fri Jan 06, 2017  2036 Called on call hand, Dr Amedeo Plenty. Discussed xray result with patient, will await Dr Amedeo Plenty for further treatment.  [AY]  2121 Dr Amedeo Plenty returned call, posterior splint, follow up on 9/4 at 10am  [AY]    Clinical Course User Index [AY] Cathlean Sauer V, PA-C   Posterior splint applied. Patient to take naproxen for pain and swelling. Patient declined narcotics. Follow-up with orthopedics as scheduled. Return precautions given.  Final Clinical Impressions(s) / UC Diagnoses   Final diagnoses:  Closed nondisplaced fracture of neck of left radius, initial encounter    New Prescriptions New Prescriptions   NAPROXEN (NAPROSYN) 500 MG TABLET    Take 1 tablet (500 mg total) by mouth 2 (two) times daily.      Ok Edwards, PA-C 01/06/17 2228    Ok Edwards, PA-C 01/06/17 2228

## 2017-01-06 NOTE — ED Notes (Signed)
Ortho  Tech  Notified      States  He  Has  Tasks  To  Do  Before  He  Can  Get  Here    That  He   Will  Get  Here  When he  Can

## 2017-01-06 NOTE — ED Notes (Signed)
Ortho  Tech  Here   Splinting the  Patient

## 2017-01-30 ENCOUNTER — Other Ambulatory Visit: Payer: Self-pay | Admitting: Obstetrics & Gynecology

## 2017-01-30 DIAGNOSIS — Z1239 Encounter for other screening for malignant neoplasm of breast: Secondary | ICD-10-CM

## 2017-02-16 ENCOUNTER — Encounter: Payer: Self-pay | Admitting: Obstetrics & Gynecology

## 2017-02-16 ENCOUNTER — Ambulatory Visit (INDEPENDENT_AMBULATORY_CARE_PROVIDER_SITE_OTHER): Payer: BC Managed Care – PPO | Admitting: Obstetrics & Gynecology

## 2017-02-16 VITALS — BP 118/74 | HR 72 | Resp 14 | Ht 64.5 in | Wt 192.5 lb

## 2017-02-16 DIAGNOSIS — Z800 Family history of malignant neoplasm of digestive organs: Secondary | ICD-10-CM | POA: Diagnosis not present

## 2017-02-16 DIAGNOSIS — Z01419 Encounter for gynecological examination (general) (routine) without abnormal findings: Secondary | ICD-10-CM | POA: Diagnosis not present

## 2017-02-16 DIAGNOSIS — Z0000 Encounter for general adult medical examination without abnormal findings: Secondary | ICD-10-CM

## 2017-02-16 NOTE — Progress Notes (Signed)
56 y.o. [id number] Divorced CaucasianF here for annual exam.  Fractured her elbow about six weeks.  Doing PT.  Reports she just saw ortho and has another four weeks of PT.  States "I'm getting there".  Pleased with progress.    Denies vaginal bleeding.  Patient's last menstrual period was 05/09/2006.          Sexually active: No.  The current method of family planning is tubal ligation.    Exercising: Yes.    walking, floor exercise Smoker:  Former smoker   Health Maintenance: Pap:  11/06/15 negative, HR HPV negative, 08/02/13 negative  History of abnormal Pap:  no MMG:  02/19/16 BIRADS 1 negative  Colonoscopy:  12/15/15 normal- repeat 5 years  BMD:   never TDaP:  05/09/10 Pneumonia vaccine(s):  never Zostavax:   never Hep C testing: donates blood regularly  Screening Labs: discuss with provider, Hb today: same   reports that she quit smoking about 31 years ago. Her smoking use included Cigarettes. She has a 15.00 pack-year smoking history. She has never used smokeless tobacco. She reports that she drinks about 1.2 - 1.8 oz of alcohol per week . She reports that she does not use drugs.  Past Medical History:  Diagnosis Date  . Anemia    mild-donates blood regularly  . 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 Hypothyroid   . Ovarian cyst    right  . Seasonal allergies     Past Surgical History:  Procedure Laterality Date  . COLONOSCOPY W/ BIOPSIES  7/99   high grade dysplasia  . COLONOSCOPY WITH PROPOFOL N/A 12/15/2015   Procedure: COLONOSCOPY WITH PROPOFOL;  Surgeon: Garlan Fair, MD;  Location: [facility];  Service: Endoscopy;  Laterality: N/A;  . DILATION AND CURETTAGE OF UTERUS     miscarriage  . INTRAUTERINE DEVICE (IUD) INSERTION  7/07  . IUD REMOVAL  12/12  . TUBAL LIGATION      Current Outpatient Prescriptions  Medication Sig Dispense Refill  . Acetaminophen (TYLENOL EXTRA STRENGTH PO) Take by mouth as needed.    . fexofenadine (ALLEGRA) 60 MG tablet Take 60  mg by mouth 2 (two) times daily.    . fluticasone (FLONASE) 50 MCG/ACT nasal spray Place into both nostrils daily.    . Multiple Vitamins-Minerals (MULTIVITAMIN ADULT PO) Take by mouth.     No current facility-administered medications for this visit.     Family History  Problem Relation Age of Onset  . CVA Mother        27s, smoking, alcohol  . Colitis Father        ostomy bag  . Leukemia Father        into 78s  . Diabetes Paternal Grandmother   . Heart disease Maternal Grandfather   . Heart disease Paternal Grandfather        heart attack  . Tuberculosis Sister        unknown how developed    ROS:  Pertinent items are noted in HPI.  Otherwise, a comprehensive ROS was negative.  Exam:   BP 118/74 (BP Location: Right Arm, Patient Position: Sitting, Cuff Size: Normal)   Pulse 72   Resp 14   Ht 5' 4.5" (1.638 m)   Wt 192 lb 8 oz (87.3 kg)   LMP 05/09/2006   BMI 32.53 kg/m      Height: 5' 4.5" (163.8 cm)  Ht Readings from Last 3 Encounters:  02/16/17 5' 4.5" (1.638 m)  12/15/15 5\' 5"  (1.651 m)  11/06/15 5' 4.5" (1.638 m)   General appearance: alert, cooperative and appears stated age Head: Normocephalic, without obvious abnormality, atraumatic Neck: no adenopathy, supple, symmetrical, trachea midline and thyroid normal to inspection and palpation Lungs: clear to auscultation bilaterally Breasts: normal appearance, no masses or tenderness Heart: regular rate and rhythm Abdomen: soft, non-tender; bowel sounds normal; no masses,  no organomegaly Extremities: extremities normal, atraumatic, no cyanosis or edema Skin: Skin color, texture, turgor normal. No rashes or lesions Lymph nodes: Cervical, supraclavicular, and axillary nodes normal. No abnormal inguinal nodes palpated Neurologic: Grossly normal   Pelvic: External genitalia:  no lesions              Urethra:  normal appearing urethra with no masses, tenderness or lesions              Bartholins and Skenes: normal                  Vagina: normal appearing vagina with normal color and discharge, no lesions              Cervix: no lesions              Pap taken: No. Bimanual Exam:  Uterus:  normal size, contour, position, consistency, mobility, non-tender              Adnexa: normal adnexa and no mass, fullness, tenderness               Rectovaginal: Confirms               Anus:  normal sphincter tone, no lesions  Chaperone was present for exam.  A:  Well Woman with normal exam PMP, no HRT Family hx of colon cancer or rectal cancer (not exactly sure) in father H/O prior smoking  P:   Mammogram guidelines reviewed.  Pt doing yearly pap smear with HR HPV done 2017.  No Pap smear today. Vit D, TSH, Lipids, CMP, CBC obtained today Colonoscopy every five years.  Pt is UTD. return annually or prn

## 2017-02-17 LAB — LIPID PANEL
CHOLESTEROL TOTAL: 225 mg/dL — AB (ref 100–199)
Chol/HDL Ratio: 3.6 ratio (ref 0.0–4.4)
HDL: 62 mg/dL (ref >39)
LDL Calculated: 150 mg/dL — ABNORMAL HIGH (ref 0–99)
TRIGLYCERIDES: 66 mg/dL (ref 0–149)
VLDL CHOLESTEROL CAL: 13 mg/dL (ref 5–40)

## 2017-02-17 LAB — CBC
Hematocrit: 37.5 % (ref 34.0–46.6)
Hemoglobin: 11.5 g/dL (ref 11.1–15.9)
MCH: 25.1 pg — AB (ref 26.6–33.0)
MCHC: 30.7 g/dL — ABNORMAL LOW (ref 31.5–35.7)
MCV: 82 fL (ref 79–97)
PLATELETS: 303 10*3/uL (ref 150–379)
RBC: 4.58 x10E6/uL (ref 3.77–5.28)
RDW: 16.3 % — AB (ref 12.3–15.4)
WBC: 5.8 10*3/uL (ref 3.4–10.8)

## 2017-02-17 LAB — COMPREHENSIVE METABOLIC PANEL
A/G RATIO: 1.5 (ref 1.2–2.2)
ALT: 8 IU/L (ref 0–32)
AST: 15 IU/L (ref 0–40)
Albumin: 4.3 g/dL (ref 3.5–5.5)
Alkaline Phosphatase: 64 IU/L (ref 39–117)
BILIRUBIN TOTAL: 0.3 mg/dL (ref 0.0–1.2)
BUN/Creatinine Ratio: 30 — ABNORMAL HIGH (ref 9–23)
BUN: 21 mg/dL (ref 6–24)
CO2: 25 mmol/L (ref 20–29)
Calcium: 9.6 mg/dL (ref 8.7–10.2)
Chloride: 104 mmol/L (ref 96–106)
Creatinine, Ser: 0.7 mg/dL (ref 0.57–1.00)
GFR calc Af Amer: 110 mL/min/{1.73_m2} (ref >59)
GFR calc non Af Amer: 96 mL/min/{1.73_m2} (ref >59)
GLUCOSE: 83 mg/dL (ref 65–99)
Globulin, Total: 2.8 g/dL (ref 1.5–4.5)
POTASSIUM: 4.3 mmol/L (ref 3.5–5.2)
Sodium: 142 mmol/L (ref 134–144)
Total Protein: 7.1 g/dL (ref 6.0–8.5)

## 2017-02-17 LAB — TSH: TSH: 3.26 u[IU]/mL (ref 0.450–4.500)

## 2017-02-17 LAB — VITAMIN D 25 HYDROXY (VIT D DEFICIENCY, FRACTURES): Vit D, 25-Hydroxy: 31.3 ng/mL (ref 30.0–100.0)

## 2017-02-21 ENCOUNTER — Ambulatory Visit
Admission: RE | Admit: 2017-02-21 | Discharge: 2017-02-21 | Disposition: A | Discharge status: Home / Self Care | Payer: BC Managed Care – PPO | Source: Ambulatory Visit | Attending: Obstetrics & Gynecology | Admitting: Obstetrics & Gynecology

## 2017-02-21 DIAGNOSIS — Z1239 Encounter for other screening for malignant neoplasm of breast: Secondary | ICD-10-CM

## 2017-05-09 DIAGNOSIS — S62101A Fracture of unspecified carpal bone, right wrist, initial encounter for closed fracture: Secondary | ICD-10-CM

## 2017-05-09 HISTORY — DX: Fracture of unspecified carpal bone, right wrist, initial encounter for closed fracture: S62.101A

## 2018-01-17 ENCOUNTER — Other Ambulatory Visit: Payer: Self-pay | Admitting: Obstetrics & Gynecology

## 2018-01-17 DIAGNOSIS — Z1231 Encounter for screening mammogram for malignant neoplasm of breast: Secondary | ICD-10-CM

## 2018-02-22 ENCOUNTER — Ambulatory Visit
Admission: RE | Admit: 2018-02-22 | Discharge: 2018-02-22 | Disposition: A | Discharge status: Home / Self Care | Payer: BC Managed Care – PPO | Source: Ambulatory Visit | Attending: Obstetrics & Gynecology | Admitting: Obstetrics & Gynecology

## 2018-02-22 DIAGNOSIS — Z1231 Encounter for screening mammogram for malignant neoplasm of breast: Secondary | ICD-10-CM

## 2018-04-20 ENCOUNTER — Ambulatory Visit: Payer: BC Managed Care – PPO | Admitting: Obstetrics & Gynecology

## 2018-04-20 ENCOUNTER — Encounter: Payer: Self-pay | Admitting: Obstetrics & Gynecology

## 2018-04-20 ENCOUNTER — Other Ambulatory Visit (HOSPITAL_COMMUNITY)
Admission: RE | Admit: 2018-04-20 | Discharge: 2018-04-20 | Disposition: A | Discharge status: Home / Self Care | Payer: BC Managed Care – PPO | Source: Ambulatory Visit | Attending: Obstetrics & Gynecology | Admitting: Obstetrics & Gynecology

## 2018-04-20 ENCOUNTER — Other Ambulatory Visit: Payer: Self-pay

## 2018-04-20 VITALS — BP 126/88 | HR 76 | Resp 16 | Ht 64.5 in | Wt 190.6 lb

## 2018-04-20 DIAGNOSIS — Z124 Encounter for screening for malignant neoplasm of cervix: Secondary | ICD-10-CM

## 2018-04-20 DIAGNOSIS — E2839 Other primary ovarian failure: Secondary | ICD-10-CM | POA: Diagnosis not present

## 2018-04-20 DIAGNOSIS — Z01419 Encounter for gynecological examination (general) (routine) without abnormal findings: Secondary | ICD-10-CM | POA: Diagnosis not present

## 2018-04-20 NOTE — Progress Notes (Addendum)
57 y.o. G54P0013 Divorced White or Caucasian female here for annual exam.  Broke bone in wrist in October after a fall.  Has never had a BMD.    Denies vaginal bleeding.   Having diarrhea today.  Just doesn't feel well.   Has new PCP she is seeing in January.    Patient's last menstrual period was 05/09/2006.          Sexually active: No.  The current method of family planning is tubal ligation.    Exercising: Yes.    walking Smoker:  no  Health Maintenance: Pap:  11/06/15 Neg. HR HPV:neg   08/02/13 neg  History of abnormal Pap:  no MMG:  02/22/18 BIRADS1:neg  Colonoscopy:  12/15/15 f/u 5 years  BMD:   Never.  Will schedule.    TDaP:  2012 Pneumonia vaccine(s):  n/a Shingrix:  D/w pt today Hep C testing: donates blood every six weeks Screening Labs: if needed     reports that she quit smoking about 32 years ago. Her smoking use included cigarettes. She has a 15.00 pack-year smoking history. She has never used smokeless tobacco. She reports current alcohol use of about 1.0 - 2.0 standard drinks of alcohol per week. She reports that she does not use drugs.  Past Medical History:  Diagnosis Date  . Anemia    mild-donates blood regularly  . 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 Fracture of right wrist 2019  . Hypothyroid   . Ovarian cyst    right  . Seasonal allergies     Past Surgical History:  Procedure Laterality Date  . COLONOSCOPY W/ BIOPSIES  7/99   high grade dysplasia  . COLONOSCOPY WITH PROPOFOL N/A 12/15/2015   Procedure: COLONOSCOPY WITH PROPOFOL;  Surgeon: Garlan Fair, MD;  Location: [facility];  Service: Endoscopy;  Laterality: N/A;  . DILATION AND CURETTAGE OF UTERUS     miscarriage  . INTRAUTERINE DEVICE (IUD) INSERTION  7/07  . IUD REMOVAL  12/12  . TUBAL LIGATION      Current Outpatient Medications  Medication Sig Dispense Refill  . Acetaminophen (TYLENOL EXTRA STRENGTH PO) Take by mouth as needed.    . fluticasone (FLONASE) 50 MCG/ACT  nasal spray Place into both nostrils daily.    . Multiple Vitamins-Minerals (MULTIVITAMIN ADULT PO) Take by mouth.     No current facility-administered medications for this visit.     Family History  Problem Relation Age of Onset  . CVA Mother        49s, smoking, alcohol  . Colitis Father        ostomy bag  . Leukemia Father        into 15s  . Diabetes Paternal Grandmother   . Heart disease Maternal Grandfather   . Heart disease Paternal Grandfather        heart attack  . Tuberculosis Sister        unknown how developed    Review of Systems  All other systems reviewed and are negative.   Exam:   BP 126/88 (BP Location: Left Arm, Patient Position: Sitting, Cuff Size: Large)   Pulse 76   Resp 16   Ht 5' 4.5" (1.638 m)   Wt 190 lb 9.6 oz (86.5 kg)   LMP 05/09/2006   BMI 32.21 kg/m   Height:   Height: 5' 4.5" (163.8 cm)  Ht Readings from Last 3 Encounters:  04/20/18 5' 4.5" (1.638 m)  02/16/17 5' 4.5" (1.638  m)  12/15/15 5\' 5"  (1.651 m)    General appearance: alert, cooperative and appears stated age Head: Normocephalic, without obvious abnormality, atraumatic Neck: no adenopathy, supple, symmetrical, trachea midline and thyroid normal to inspection and palpation Lungs: clear to auscultation bilaterally Breasts: normal appearance, no masses or tenderness Heart: regular rate and rhythm Abdomen: soft, non-tender; bowel sounds normal; no masses,  no organomegaly Extremities: extremities normal, atraumatic, no cyanosis or edema Skin: Skin color, texture, turgor normal. No rashes or lesions Lymph nodes: Cervical, supraclavicular, and axillary nodes normal. No abnormal inguinal nodes palpated Neurologic: Grossly normal   Pelvic: External genitalia:  no lesions              Urethra:  normal appearing urethra with no masses, tenderness or lesions              Bartholins and Skenes: normal                 Vagina: normal appearing vagina with normal color and discharge,  no lesions              Cervix: no lesions              Pap taken: Yes.   Bimanual Exam:  Uterus:  normal size, contour, position, consistency, mobility, non-tender              Adnexa: normal adnexa and no mass, fullness, tenderness              Rectal exam not performed due to diarrhea she's experiencing today               Anus:  normal sphincter tone, no lesions  Chaperone was present for exam.  A:  Well Woman with normal exam PMP, no HRT Family hx of colon cancer or rectal cancer (not completely sure) in her father Prior smoking hx  P:   Mammogram guidelines reviewed BMD ordered for next October to do with MMG.  Order placed. pap smear obtained today Lab work will be done with new PCP in January Colonoscopy due every 5 years. Return annually or prn

## 2018-04-23 LAB — CYTOLOGY - PAP: Diagnosis: NEGATIVE

## 2018-05-28 ENCOUNTER — Other Ambulatory Visit: Payer: Self-pay | Admitting: Obstetrics & Gynecology

## 2018-05-28 ENCOUNTER — Ambulatory Visit: Payer: Self-pay | Admitting: Family Medicine

## 2018-05-28 DIAGNOSIS — Z1231 Encounter for screening mammogram for malignant neoplasm of breast: Secondary | ICD-10-CM

## 2019-02-25 ENCOUNTER — Other Ambulatory Visit: Payer: Self-pay

## 2019-02-25 ENCOUNTER — Ambulatory Visit
Admission: RE | Admit: 2019-02-25 | Discharge: 2019-02-25 | Disposition: A | Discharge status: Home / Self Care | Payer: BC Managed Care – PPO | Source: Ambulatory Visit | Attending: Obstetrics & Gynecology | Admitting: Obstetrics & Gynecology

## 2019-02-25 DIAGNOSIS — E2839 Other primary ovarian failure: Secondary | ICD-10-CM

## 2019-02-25 DIAGNOSIS — Z1231 Encounter for screening mammogram for malignant neoplasm of breast: Secondary | ICD-10-CM

## 2019-09-02 NOTE — Progress Notes (Signed)
59 y.o. G33P0013 Divorced White or Caucasian female here for annual exam.  Denies vaginal bleeding.  Family is doing well.  She did receive her vaccination.  Donates blood regularly, every 8 weeks.  She has been declined due to anemia.  Patient's last menstrual period was 05/09/2006.          Sexually active: No.  The current method of family planning is tubal ligation.    Exercising: Yes.    walking, floor exercises Smoker:  no  Health Maintenance:   Pap:  11-06-15 neg HPV HR neg, 04-20-18 neg History of abnormal Pap:  no MMG:  02-25-2019 category b density birads 1:neg Colonoscopy:  2017 f/u 53yrs BMD:   02-25-2019 mild osteopenia, repeat 22yrs TDaP:  2012 Pneumonia vaccine(s):  none Shingrix:   2020 Hep C testing: donates blood Screening Labs: obtain today   reports that she quit smoking about 33 years ago. Her smoking use included cigarettes. She has a 15.00 pack-year smoking history. She has never used smokeless tobacco. She reports current alcohol use of about 1.0 - 2.0 standard drinks of alcohol per week. She reports that she does not use drugs.  Past Medical History:  Diagnosis Date  . Anemia    mild-donates blood regularly  . 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 Fracture of right wrist 2019  . Hypothyroid   . Ovarian cyst    right  . Seasonal allergies     Past Surgical History:  Procedure Laterality Date  . COLONOSCOPY W/ BIOPSIES  7/99   high grade dysplasia  . COLONOSCOPY WITH PROPOFOL N/A 12/15/2015   Procedure: COLONOSCOPY WITH PROPOFOL;  Surgeon: Garlan Fair, MD;  Location: [facility];  Service: Endoscopy;  Laterality: N/A;  . DILATION AND CURETTAGE OF UTERUS     miscarriage  . INTRAUTERINE DEVICE (IUD) INSERTION  7/07  . IUD REMOVAL  12/12  . TUBAL LIGATION      Current Outpatient Medications  Medication Sig Dispense Refill  . cetirizine (ZYRTEC) 10 MG tablet Take 10 mg by mouth daily.    . fluticasone (FLONASE) 50 MCG/ACT nasal spray Place  into both nostrils daily.    . Multiple Vitamins-Minerals (MULTIVITAMIN ADULT PO) Take by mouth.    Marland Kitchen VITAMIN D PO Take by mouth.     No current facility-administered medications for this visit.    Family History  Problem Relation Age of Onset  . CVA Mother        34s, smoking, alcohol  . Colitis Father        ostomy bag  . Leukemia Father        into 22s  . Diabetes Paternal Grandmother   . Heart disease Maternal Grandfather   . Heart disease Paternal Grandfather        heart attack  . Tuberculosis Sister        unknown how developed    Review of Systems  Constitutional: Negative.   HENT: Negative.   Eyes: Negative.   Respiratory: Negative.   Cardiovascular: Negative.   Gastrointestinal: Negative.   Endocrine: Negative.   Genitourinary: Negative.   Musculoskeletal: Negative.   Skin: Negative.   Allergic/Immunologic: Negative.   Neurological: Negative.   Psychiatric/Behavioral: Negative.     Exam:   BP 120/78   Pulse 72   Temp 97.7 F (36.5 C) (Skin)   Resp 16   Ht 5' 5.75" (1.67 m)   Wt 195 lb (88.5 kg)   LMP 05/09/2006  BMI 31.71 kg/m   Height: 5' 5.75" (167 cm)  General appearance: alert, cooperative and appears stated age Head: Normocephalic, without obvious abnormality, atraumatic Neck: no adenopathy, supple, symmetrical, trachea midline and thyroid normal to inspection and palpation Lungs: clear to auscultation bilaterally Breasts: normal appearance, no masses or tenderness Heart: regular rate and rhythm Abdomen: soft, non-tender; bowel sounds normal; no masses,  no organomegaly Extremities: extremities normal, atraumatic, no cyanosis or edema Skin: Skin color, texture, turgor normal. No rashes or lesions Lymph nodes: Cervical, supraclavicular, and axillary nodes normal. No abnormal inguinal nodes palpated Neurologic: Grossly normal   Pelvic: External genitalia:  no lesions              Urethra:  normal appearing urethra with no masses,  tenderness or lesions              Bartholins and Skenes: normal                 Vagina: normal appearing vagina with normal color and discharge, no lesions              Cervix: no lesions              Pap taken: Yes.   Bimanual Exam:  Uterus:  normal size, contour, position, consistency, mobility, non-tender              Adnexa: normal adnexa and no mass, fullness, tenderness               Rectovaginal: Confirms               Anus:  normal sphincter tone, no lesions  Chaperone, Royal Hawthorn, CMA, was present for exam.  A:  Well Woman with normal exam PMP, no HRT Family hx of colon cancer or rectal cancer (not exactly sure) in future H/o prior smoker Mild osteopenia  P:   Mammogram guidelines reviewed.  This is UTD. pap smear with HR HPV obtained today. Lab work obtained today Iron studies obtained today Colonoscopy due next year Tdap due next year BMD UTD return annually or prn

## 2019-09-04 ENCOUNTER — Other Ambulatory Visit: Payer: Self-pay

## 2019-09-05 ENCOUNTER — Other Ambulatory Visit: Payer: Self-pay

## 2019-09-05 ENCOUNTER — Encounter: Payer: Self-pay | Admitting: Obstetrics & Gynecology

## 2019-09-05 ENCOUNTER — Ambulatory Visit: Payer: BC Managed Care – PPO | Admitting: Obstetrics & Gynecology

## 2019-09-05 ENCOUNTER — Other Ambulatory Visit (HOSPITAL_COMMUNITY)
Admission: RE | Admit: 2019-09-05 | Discharge: 2019-09-05 | Disposition: A | Discharge status: Home / Self Care | Payer: BC Managed Care – PPO | Source: Ambulatory Visit | Attending: Obstetrics & Gynecology | Admitting: Obstetrics & Gynecology

## 2019-09-05 VITALS — BP 120/78 | HR 72 | Temp 97.7°F | Resp 16 | Ht 65.75 in | Wt 195.0 lb

## 2019-09-05 DIAGNOSIS — Z0000 Encounter for general adult medical examination without abnormal findings: Secondary | ICD-10-CM

## 2019-09-05 DIAGNOSIS — Z01419 Encounter for gynecological examination (general) (routine) without abnormal findings: Secondary | ICD-10-CM | POA: Diagnosis not present

## 2019-09-05 DIAGNOSIS — D500 Iron deficiency anemia secondary to blood loss (chronic): Secondary | ICD-10-CM

## 2019-09-05 NOTE — Patient Instructions (Signed)
Rachael Munoz or [name]

## 2019-09-06 LAB — LIPID PANEL
Chol/HDL Ratio: 3.6 ratio (ref 0.0–4.4)
Cholesterol, Total: 232 mg/dL — ABNORMAL HIGH (ref 100–199)
HDL: 65 mg/dL (ref >39)
LDL Chol Calc (NIH): 153 mg/dL — ABNORMAL HIGH (ref 0–99)
Triglycerides: 81 mg/dL (ref 0–149)
VLDL Cholesterol Cal: 14 mg/dL (ref 5–40)

## 2019-09-06 LAB — CYTOLOGY - PAP
Comment: NEGATIVE
Diagnosis: NEGATIVE
High risk HPV: NEGATIVE

## 2019-09-06 LAB — COMPREHENSIVE METABOLIC PANEL
ALT: 11 IU/L (ref 0–32)
AST: 18 IU/L (ref 0–40)
Albumin/Globulin Ratio: 1.7 (ref 1.2–2.2)
Albumin: 4.3 g/dL (ref 3.8–4.8)
Alkaline Phosphatase: 74 IU/L (ref 39–117)
BUN/Creatinine Ratio: 23 (ref 12–28)
BUN: 19 mg/dL (ref 8–27)
Bilirubin Total: 0.2 mg/dL (ref 0.0–1.2)
CO2: 26 mmol/L (ref 20–29)
Calcium: 9.7 mg/dL (ref 8.7–10.3)
Chloride: 104 mmol/L (ref 96–106)
Creatinine, Ser: 0.83 mg/dL (ref 0.57–1.00)
GFR calc Af Amer: 88 mL/min/{1.73_m2} (ref >59)
GFR calc non Af Amer: 76 mL/min/{1.73_m2} (ref >59)
Globulin, Total: 2.6 g/dL (ref 1.5–4.5)
Glucose: 72 mg/dL (ref 65–99)
Potassium: 4.6 mmol/L (ref 3.5–5.2)
Sodium: 141 mmol/L (ref 134–144)
Total Protein: 6.9 g/dL (ref 6.0–8.5)

## 2019-09-06 LAB — VITAMIN D 25 HYDROXY (VIT D DEFICIENCY, FRACTURES): Vit D, 25-Hydroxy: 49.2 ng/mL (ref 30.0–100.0)

## 2019-09-06 LAB — CBC
Hematocrit: 36.9 % (ref 34.0–46.6)
Hemoglobin: 10.6 g/dL — ABNORMAL LOW (ref 11.1–15.9)
MCH: 22.6 pg — ABNORMAL LOW (ref 26.6–33.0)
MCHC: 28.7 g/dL — ABNORMAL LOW (ref 31.5–35.7)
MCV: 79 fL (ref 79–97)
Platelets: 296 10*3/uL (ref 150–450)
RBC: 4.7 x10E6/uL (ref 3.77–5.28)
RDW: 15.8 % — ABNORMAL HIGH (ref 11.7–15.4)
WBC: 5.9 10*3/uL (ref 3.4–10.8)

## 2019-09-06 LAB — TSH: TSH: 3.09 u[IU]/mL (ref 0.450–4.500)

## 2019-09-06 LAB — IRON,TIBC AND FERRITIN PANEL
Ferritin: 8 ng/mL — ABNORMAL LOW (ref 15–150)
Iron Saturation: 6 % — CL (ref 15–55)
Iron: 24 ug/dL — ABNORMAL LOW (ref 27–139)
Total Iron Binding Capacity: 379 ug/dL (ref 250–450)
UIBC: 355 ug/dL (ref 118–369)

## 2019-09-08 NOTE — Addendum Note (Signed)
Addended by: Megan Salon on: 09/08/2019 07:53 PM   Modules accepted: Orders

## 2019-09-09 ENCOUNTER — Telehealth: Payer: Self-pay

## 2019-09-09 NOTE — Telephone Encounter (Signed)
-----   Message from Megan Salon, MD sent at 09/08/2019  7:53 PM EDT ----- Please let pt know her hemoglobin is a little low at 10.6.  Also, her iron level and ferritin are low as well.  She donates blood regularly so she needs to start FeSo4 daily if possible.  Take with orange juice to increase absorption.  She may have constipation with this so she may need to add colace 100mg  twice daily as well.  She should hold on donating blood and have repeat CBC and iron studies in 3 months.  Orders placed.  Her CMP, TSH and CMP were fine.  Her cholesterol was a little elevated at 232 and LDLs as well at 153.  The ACC/AHA cardiovascular risk calculation shows that her risks of a cardiovascular event in the next 10 years is 3.2%.  Treatment indicated if this risk is >7.5%.  We will just keep watching.  Lastly, her pap was negative and HR HPV was negative.  02 recall.

## 2019-09-09 NOTE — Telephone Encounter (Signed)
Patient returned a call to Joy. °

## 2019-09-09 NOTE — Telephone Encounter (Signed)
Patient is returning call to Joy. °

## 2019-09-09 NOTE — Telephone Encounter (Signed)
Patient notified of results as written by provider 

## 2019-09-09 NOTE — Telephone Encounter (Signed)
Left message to call back  

## 2019-09-09 NOTE — Telephone Encounter (Signed)
Left message for call back.

## 2019-12-09 ENCOUNTER — Other Ambulatory Visit (INDEPENDENT_AMBULATORY_CARE_PROVIDER_SITE_OTHER): Payer: BC Managed Care – PPO

## 2019-12-09 ENCOUNTER — Other Ambulatory Visit: Payer: Self-pay

## 2019-12-09 DIAGNOSIS — D500 Iron deficiency anemia secondary to blood loss (chronic): Secondary | ICD-10-CM

## 2019-12-10 ENCOUNTER — Telehealth: Payer: Self-pay

## 2019-12-10 ENCOUNTER — Encounter: Payer: Self-pay | Admitting: Obstetrics & Gynecology

## 2019-12-10 LAB — IRON,TIBC AND FERRITIN PANEL
Ferritin: 17 ng/mL (ref 15–150)
Iron Saturation: 36 % (ref 15–55)
Iron: 111 ug/dL (ref 27–139)
Total Iron Binding Capacity: 308 ug/dL (ref 250–450)
UIBC: 197 ug/dL (ref 118–369)

## 2019-12-10 LAB — CBC
Hematocrit: 44.6 % (ref 34.0–46.6)
Hemoglobin: 13.6 g/dL (ref 11.1–15.9)
MCH: 26.8 pg (ref 26.6–33.0)
MCHC: 30.5 g/dL — ABNORMAL LOW (ref 31.5–35.7)
MCV: 88 fL (ref 79–97)
Platelets: 237 10*3/uL (ref 150–450)
RBC: 5.08 x10E6/uL (ref 3.77–5.28)
RDW: 17.7 % — ABNORMAL HIGH (ref 11.7–15.4)
WBC: 5.6 10*3/uL (ref 3.4–10.8)

## 2019-12-10 NOTE — Telephone Encounter (Signed)
Samhita, Kretsch  P [facility] Good Morning. According to my results from yesterdays test, do you recommend I continue with prenatal with iron pill each day? Also, when do you suggest I should start donating again?   Thanks! Mechele Claude

## 2019-12-10 NOTE — Telephone Encounter (Signed)
Routing to Dr. Sabra Heck to review 12/09/19 lab results and advise.

## 2019-12-12 NOTE — Telephone Encounter (Signed)
Pt has seen results but let her know hemoglobin increased from 10 to 13.6 and iron levels are all normal.  She can give blood again.  I think it might be a good idea to take iron for a week or two after giving blood each time as she is a regular donators and this is most likely why her hemoglobin and iron levels decreased.  I don't think she needs to keep taking the iron daily but just a little extra when she donates blood.  Thanks.

## 2019-12-12 NOTE — Telephone Encounter (Signed)
Spoke with patient, advised per Dr. Miller. Patient verbalizes understanding and is agreeable. Encounter closed.  

## 2019-12-16 ENCOUNTER — Other Ambulatory Visit: Payer: Self-pay

## 2019-12-16 ENCOUNTER — Encounter: Payer: Self-pay | Admitting: Family Medicine

## 2019-12-16 ENCOUNTER — Other Ambulatory Visit: Payer: BC Managed Care – PPO

## 2019-12-16 ENCOUNTER — Ambulatory Visit: Payer: BC Managed Care – PPO | Admitting: Family Medicine

## 2019-12-16 DIAGNOSIS — D649 Anemia, unspecified: Secondary | ICD-10-CM

## 2019-12-16 DIAGNOSIS — J302 Other seasonal allergic rhinitis: Secondary | ICD-10-CM | POA: Diagnosis not present

## 2019-12-16 MED ORDER — CETIRIZINE HCL 10 MG PO TABS: 10.0000 mg | ORAL_TABLET | Freq: Every day | ORAL | 1 refills | Status: DC

## 2019-12-16 MED ORDER — FLUTICASONE PROPIONATE 50 MCG/ACT NA SUSP: 1.0000 | Freq: Every day | NASAL | 5 refills | Status: DC

## 2019-12-16 NOTE — Progress Notes (Addendum)
Rachael Munoz DOB: [birth year] Encounter date: 12/16/2019  This is a 59 y.o. female who presents to establish care. Chief Complaint  Patient presents with  . Establish Care    History of present illness: Has a little spot in lip that she was worried about - thinks in last year. Thought little blood vessel - just wondering if something to be worried about.   Taking zyrtec and flonase continuously.   Follows with dr. Sabra Heck for gyn needs. Last dexa, mammogram 02/2019 (osteopenia) (normal mamm). Recent labwork showing iron def anemia. She does donate blood q 8 weeks. She is taking prenatals with iron supplement. She was able to donate again on Friday.   Last colonoscopy 12/2015 w recommendation to repeat in 5 years (12/2020)  Has bp checked when giving blood: 128/84, 120/66, 128/84, 130/78, 110/80, 124/82  Past Medical History:  Diagnosis Date  . Allergy   . Anemia    mild-donates blood regularly  . 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 Fracture of right wrist 2019  . Hypothyroid   . Ovarian cyst    right  . Seasonal allergies    Past Surgical History:  Procedure Laterality Date  . COLONOSCOPY W/ BIOPSIES  7/99   high grade dysplasia  . COLONOSCOPY WITH PROPOFOL N/A 12/15/2015   Procedure: COLONOSCOPY WITH PROPOFOL;  Surgeon: Garlan Fair, MD;  Location: [facility];  Service: Endoscopy;  Laterality: N/A;  . DILATION AND CURETTAGE OF UTERUS     miscarriage  . INTRAUTERINE DEVICE (IUD) INSERTION  7/07  . IUD REMOVAL  12/12  . TUBAL LIGATION     No Known Allergies Current Meds  Medication Sig  . cetirizine (ZYRTEC) 10 MG tablet Take 1 tablet (10 mg total) by mouth daily.  . fluticasone (FLONASE) 50 MCG/ACT nasal spray Place 1 spray into both nostrils daily.  . Multiple Vitamins-Minerals (MULTIVITAMIN ADULT PO) Take by mouth.  Marland Kitchen OVER THE COUNTER MEDICATION Prenatal vitamin with iron  . VITAMIN D PO Take 2,000 Units by mouth.   . [DISCONTINUED] cetirizine (ZYRTEC)  10 MG tablet Take 10 mg by mouth daily.  . [DISCONTINUED] fluticasone (FLONASE) 50 MCG/ACT nasal spray Place into both nostrils daily.   Social History   Tobacco Use  . Smoking status: Former Smoker    Packs/day: 1.50    Years: 10.00    Pack years: 15.00    Types: Cigarettes    Quit date: 10/19/1985    Years since quitting: 34.1  . Smokeless tobacco: Never Used  Substance Use Topics  . Alcohol use: Yes    Alcohol/week: 1.0 - 2.0 standard drink    Types: 1 - 2 Standard drinks or equivalent per week   Family History  Problem Relation Age of Onset  . CVA Mother 39       40s, smoking, alcohol  . Miscarriages / [place] Mother   . Depression Mother   . Arthritis Mother   . Alcohol abuse Mother   . Colitis Father        ostomy bag  . Leukemia Father        into 50s  . Colon cancer Father 63  . Diabetes Paternal Grandmother   . Arthritis Paternal Grandmother   . Heart disease Maternal Grandfather   . Heart disease Paternal Grandfather        heart attack  . Tuberculosis Sister        unknown how developed     Review of Systems  Constitutional: Negative for chills, fatigue and fever.  Respiratory: Negative for cough, chest tightness, shortness of breath and wheezing.   Cardiovascular: Negative for chest pain, palpitations and leg swelling.    Objective:  LMP 05/09/2006       BP Readings from Last 3 Encounters:  09/05/19 120/78  04/20/18 126/88  02/16/17 118/74   Wt Readings from Last 3 Encounters:  09/05/19 195 lb (88.5 kg)  04/20/18 190 lb 9.6 oz (86.5 kg)  02/16/17 192 lb 8 oz (87.3 kg)    Physical Exam Constitutional:      General: She is not in acute distress.    Appearance: She is well-developed.  Cardiovascular:     Rate and Rhythm: Normal rate and regular rhythm.     Heart sounds: Normal heart sounds. No murmur heard.  No friction rub.  Pulmonary:     Effort: Pulmonary effort is normal. No respiratory distress.     Breath sounds: Normal breath  sounds. No wheezing or rales.  Musculoskeletal:     Right lower leg: No edema.     Left lower leg: No edema.  Neurological:     Mental Status: She is alert and oriented to person, place, and time.  Psychiatric:        Behavior: Behavior normal.     Assessment/Plan:  1. Seasonal allergies Continue with allergy prevention.  - fluticasone (FLONASE) 50 MCG/ACT nasal spray; Place 1 spray into both nostrils daily.  Dispense: 18.2 mL; Refill: 5 - cetirizine (ZYRTEC) 10 MG tablet; Take 1 tablet (10 mg total) by mouth daily.  Dispense: 90 tablet; Refill: 1  2. Anemia, unspecified type Improved with iron supplementation.    Return in about 9 months (around 09/14/2020) for physical exam.  Micheline Rough, MD

## 2019-12-22 MED ORDER — AZELASTINE-FLUTICASONE 137-50 MCG/ACT NA SUSP: 2.0000 | Freq: Every day | NASAL | 5 refills | Status: DC

## 2019-12-22 NOTE — Addendum Note (Signed)
Addended by: Caren Macadam on: 12/22/2019 07:40 PM   Modules accepted: Orders

## 2020-01-03 ENCOUNTER — Encounter: Payer: Self-pay | Admitting: Family Medicine

## 2020-01-03 ENCOUNTER — Other Ambulatory Visit: Payer: Self-pay | Admitting: Family Medicine

## 2020-01-03 ENCOUNTER — Telehealth: Payer: Self-pay | Admitting: *Deleted

## 2020-01-03 MED ORDER — TRIAMCINOLONE ACETONIDE 0.1 % EX CREA
1.0000 | TOPICAL_CREAM | Freq: Two times a day (BID) | CUTANEOUS | 0 refills | Status: DC
Start: 2020-01-03 — End: 2020-11-19

## 2020-01-03 MED ORDER — PREDNISONE 20 MG PO TABS
ORAL_TABLET | ORAL | 0 refills | Status: DC
Start: 2020-01-03 — End: 2020-11-19

## 2020-01-03 NOTE — Telephone Encounter (Signed)
Patient has used prednisone and ointment in the past. The prednisone helped a lot. Right now she is using calamine lotion since Sunday and Bendyrall pills 2 x every 4-6 hours  She tried Bendyrall ointment but the web site said not to use that to use Cortizone creme so she was going to go get that after work today to try.   The poison Rachael Munoz is in patches on both arms and a little on her neck.  She would prefer to communicate through MyChart if possible. 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  Please advise

## 2020-01-03 NOTE — Telephone Encounter (Signed)
Please see where it is, how big area, what she used at home for it, and what has worked for her in past. I am ok with sending something in for her after these details.

## 2020-01-03 NOTE — Telephone Encounter (Signed)
Prednisone and triamcinolone sent in for patient.  MyChart message sent.

## 2020-01-03 NOTE — Telephone Encounter (Signed)
Patient states she has self-medicated poison ivy for a week.  She said it is a bad case and wants to know if she can get something called in for it.

## 2020-02-03 ENCOUNTER — Other Ambulatory Visit: Payer: Self-pay | Admitting: Obstetrics & Gynecology

## 2020-02-03 DIAGNOSIS — Z1231 Encounter for screening mammogram for malignant neoplasm of breast: Secondary | ICD-10-CM

## 2020-03-17 ENCOUNTER — Other Ambulatory Visit: Payer: Self-pay

## 2020-03-17 ENCOUNTER — Ambulatory Visit
Admission: RE | Admit: 2020-03-17 | Discharge: 2020-03-17 | Disposition: A | Discharge status: Home / Self Care | Payer: BC Managed Care – PPO | Source: Ambulatory Visit | Attending: Obstetrics & Gynecology | Admitting: Obstetrics & Gynecology

## 2020-03-17 DIAGNOSIS — Z1231 Encounter for screening mammogram for malignant neoplasm of breast: Secondary | ICD-10-CM

## 2020-06-29 ENCOUNTER — Encounter: Payer: Self-pay | Admitting: Family Medicine

## 2020-09-18 ENCOUNTER — Other Ambulatory Visit: Payer: Self-pay | Admitting: Family Medicine

## 2020-11-19 ENCOUNTER — Encounter (HOSPITAL_BASED_OUTPATIENT_CLINIC_OR_DEPARTMENT_OTHER): Payer: Self-pay | Admitting: Obstetrics & Gynecology

## 2020-11-19 ENCOUNTER — Other Ambulatory Visit: Payer: Self-pay

## 2020-11-19 ENCOUNTER — Ambulatory Visit: Payer: BC Managed Care – PPO

## 2020-11-19 ENCOUNTER — Ambulatory Visit (INDEPENDENT_AMBULATORY_CARE_PROVIDER_SITE_OTHER): Payer: BC Managed Care – PPO | Admitting: Obstetrics & Gynecology

## 2020-11-19 VITALS — BP 137/82 | HR 72 | Resp 17 | Ht 64.0 in | Wt 187.6 lb

## 2020-11-19 DIAGNOSIS — Z23 Encounter for immunization: Secondary | ICD-10-CM | POA: Diagnosis not present

## 2020-11-19 DIAGNOSIS — Z1211 Encounter for screening for malignant neoplasm of colon: Secondary | ICD-10-CM | POA: Diagnosis not present

## 2020-11-19 DIAGNOSIS — Z780 Asymptomatic menopausal state: Secondary | ICD-10-CM

## 2020-11-19 DIAGNOSIS — D500 Iron deficiency anemia secondary to blood loss (chronic): Secondary | ICD-10-CM

## 2020-11-19 DIAGNOSIS — Z01419 Encounter for gynecological examination (general) (routine) without abnormal findings: Secondary | ICD-10-CM | POA: Diagnosis not present

## 2020-11-19 DIAGNOSIS — Z800 Family history of malignant neoplasm of digestive organs: Secondary | ICD-10-CM | POA: Diagnosis not present

## 2020-11-19 DIAGNOSIS — Z8739 Personal history of other diseases of the musculoskeletal system and connective tissue: Secondary | ICD-10-CM

## 2020-11-19 NOTE — Progress Notes (Signed)
60 y.o. G79P0013 Divorced White or Caucasian female here for annual exam.  Doing well.  Denies vaginal bleeding.  Works at [employer].  Loves spending time with grandchildren.  Just in a really great place in life right now.  Feels so lucky and blessed.  Patient's last menstrual period was 05/09/2006.          Sexually active: No.  The current method of family planning is tubal ligation and post menopausal status.    Exercising: Yes.     Walking, floor exercises Smoker:  no  Health Maintenance: Pap:  09/05/2019 Negative History of abnormal Pap:  no MMG:  03/17/2020 Negative Colonoscopy:  12/15/2015, follow up 5 years BMD:   02/25/2019 Mild Osteopenia, -1.3 TDaP:  05/09/10 Pneumonia vaccine(s):  not indicated Shingrix:   01/25/19, 03/22/19 Hep C testing: donates blood regularly Screening Labs: 2021   reports that she quit smoking about 35 years ago. Her smoking use included cigarettes. She has a 15.00 pack-year smoking history. She has never used smokeless tobacco. She reports current alcohol use of about 1.0 - 2.0 standard drink of alcohol per week. She reports that she does not use drugs.  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    Fracture of right wrist 2019   Hypothyroid    Ovarian cyst    right   Seasonal allergies     Past Surgical History:  Procedure Laterality Date   COLONOSCOPY W/ BIOPSIES  7/99   high grade dysplasia   COLONOSCOPY WITH PROPOFOL N/A 12/15/2015   Procedure: COLONOSCOPY WITH PROPOFOL;  Surgeon: Garlan Fair, MD;  Location: [facility];  Service: Endoscopy;  Laterality: N/A;   DILATION AND CURETTAGE OF UTERUS     miscarriage   INTRAUTERINE DEVICE (IUD) INSERTION  7/07   IUD REMOVAL  12/12   TUBAL LIGATION      Current Outpatient Medications  Medication Sig Dispense Refill   Azelastine-Fluticasone 137-50 MCG/ACT SUSP PLACE 2 SPRAYS INTO THE NOSE DAILY. 23  g 0   Multiple Vitamins-Minerals (MULTIVITAMIN ADULT PO) Take by mouth.     vitamin B-12 (CYANOCOBALAMIN) 250 MCG tablet Take 250 mcg by mouth daily.     No current facility-administered medications for this visit.    Family History  Problem Relation Age of Onset   CVA Mother 40       40s, smoking, alcohol   Miscarriages / [place] Mother    Depression Mother    Arthritis Mother    Alcohol abuse Mother    Colitis Father        ostomy bag   Leukemia Father        into 70s   Colon cancer Father 73   Diabetes Paternal Grandmother    Arthritis Paternal Grandmother    Heart disease Maternal Grandfather    Heart disease Paternal Grandfather        heart attack   Tuberculosis Sister        unknown how developed    Review of Systems  All other systems reviewed and are negative.  Exam:   BP 137/82   Pulse 72   Resp 17   Ht 5\' 4"  (1.626 m)   Wt 187 lb 9.6 oz (85.1 kg)   LMP 05/09/2006   BMI 32.20 kg/m   Height: 5\' 4"  (162.6 cm)  General appearance: alert, cooperative and appears stated age  Head: Normocephalic, without obvious abnormality, atraumatic Neck: no adenopathy, supple, symmetrical, trachea midline and thyroid normal to inspection and palpation Lungs: clear to auscultation bilaterally Breasts: normal appearance, no masses or tenderness Heart: regular rate and rhythm Abdomen: soft, non-tender; bowel sounds normal; no masses,  no organomegaly Extremities: extremities normal, atraumatic, no cyanosis or edema Skin: Skin color, texture, turgor normal. No rashes or lesions Lymph nodes: Cervical, supraclavicular, and axillary nodes normal. No abnormal inguinal nodes palpated Neurologic: Grossly normal   Pelvic: External genitalia:  no lesions              Urethra:  normal appearing urethra with no masses, tenderness or lesions              Bartholins and Skenes: normal                 Vagina: normal appearing vagina with normal color and no discharge, no lesions               Cervix: no lesions              Pap taken: No. Bimanual Exam:  Uterus:  normal size, contour, position, consistency, mobility, non-tender              Adnexa: normal adnexa and no mass, fullness, tenderness               Rectovaginal: Confirms               Anus:  normal sphincter tone, no lesions  Chaperone, Octaviano Batty, CMA, was present for exam.  Assessment/Plan: 1. Well woman exam with routine gynecological exam - pap neg with neg HR HPV 09/05/2019 - MMG 03/2020 - colonoscopy due - BMD 02/2019, -1.3 - vaccines reviewed - labs done 2021  2. Postmenopausal - no HRT  3. Family history of colon cancer in father - Ambulatory referral to Gastroenterology  4. Iron deficiency anemia due to chronic blood loss - Due to regularly donating blood.  Pt stopped donating last year and took oral iron.  Hb and iron level normalized.  She is back donating blood now.  5. History of osteopenia

## 2021-02-02 ENCOUNTER — Other Ambulatory Visit: Payer: Self-pay | Admitting: Obstetrics & Gynecology

## 2021-02-02 DIAGNOSIS — Z1231 Encounter for screening mammogram for malignant neoplasm of breast: Secondary | ICD-10-CM

## 2021-03-11 ENCOUNTER — Other Ambulatory Visit: Payer: Self-pay | Admitting: Family Medicine

## 2021-03-12 ENCOUNTER — Other Ambulatory Visit: Payer: Self-pay | Admitting: Family Medicine

## 2021-03-13 ENCOUNTER — Encounter: Payer: Self-pay | Admitting: Family Medicine

## 2021-03-16 ENCOUNTER — Telehealth: Payer: Self-pay

## 2021-03-16 MED ORDER — AZELASTINE-FLUTICASONE 137-50 MCG/ACT NA SUSP: 2.0000 | Freq: Every day | NASAL | 0 refills | Status: DC

## 2021-03-16 NOTE — Telephone Encounter (Signed)
Rx done. 

## 2021-03-16 NOTE — Addendum Note (Signed)
Addended by: Agnes Lawrence on: 03/16/2021 01:37 PM   Modules accepted: Orders

## 2021-03-16 NOTE — Telephone Encounter (Signed)
Patient called requesting Rx refill [facility] scheduled 12/21  Azelastine-Fluticasone 137-50 MCG/ACT SUSP

## 2021-03-19 ENCOUNTER — Other Ambulatory Visit: Payer: Self-pay

## 2021-03-19 ENCOUNTER — Ambulatory Visit
Admission: RE | Admit: 2021-03-19 | Discharge: 2021-03-19 | Disposition: A | Discharge status: Home / Self Care | Payer: BC Managed Care – PPO | Source: Ambulatory Visit | Attending: Obstetrics & Gynecology | Admitting: Obstetrics & Gynecology

## 2021-03-19 DIAGNOSIS — Z1231 Encounter for screening mammogram for malignant neoplasm of breast: Secondary | ICD-10-CM

## 2021-04-02 IMAGING — MG DIGITAL SCREENING BILAT W/ TOMO W/ CAD
8 series · 8 of 24 positions shown · non-contrast
Comparison: Previous exam(s).

CLINICAL DATA: Screening.

EXAM:
DIGITAL SCREENING BILATERAL MAMMOGRAM WITH TOMO AND CAD

[R MLO synth-2D]
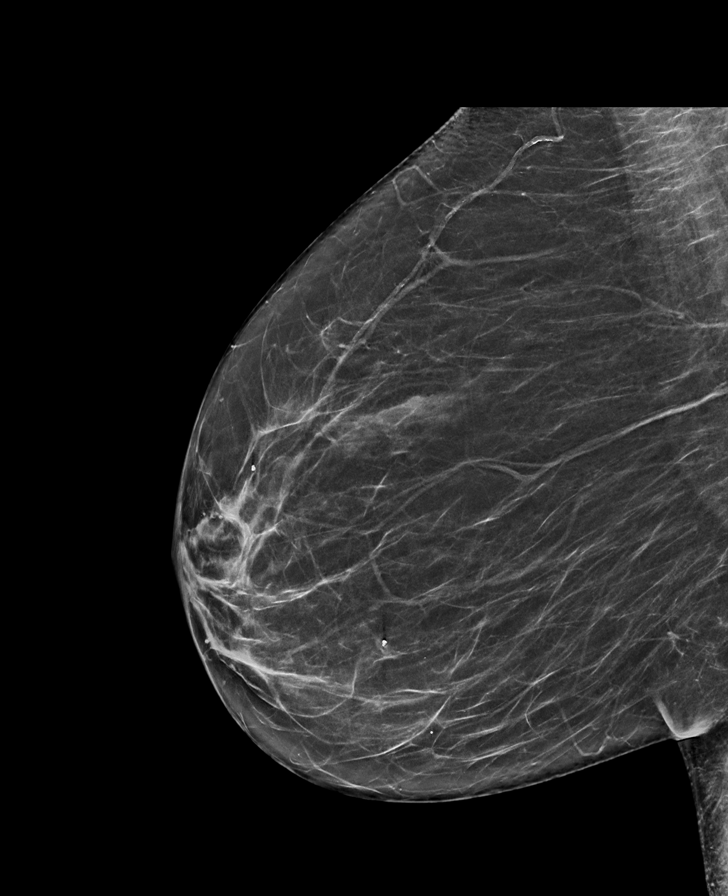

[R CC synth-2D]
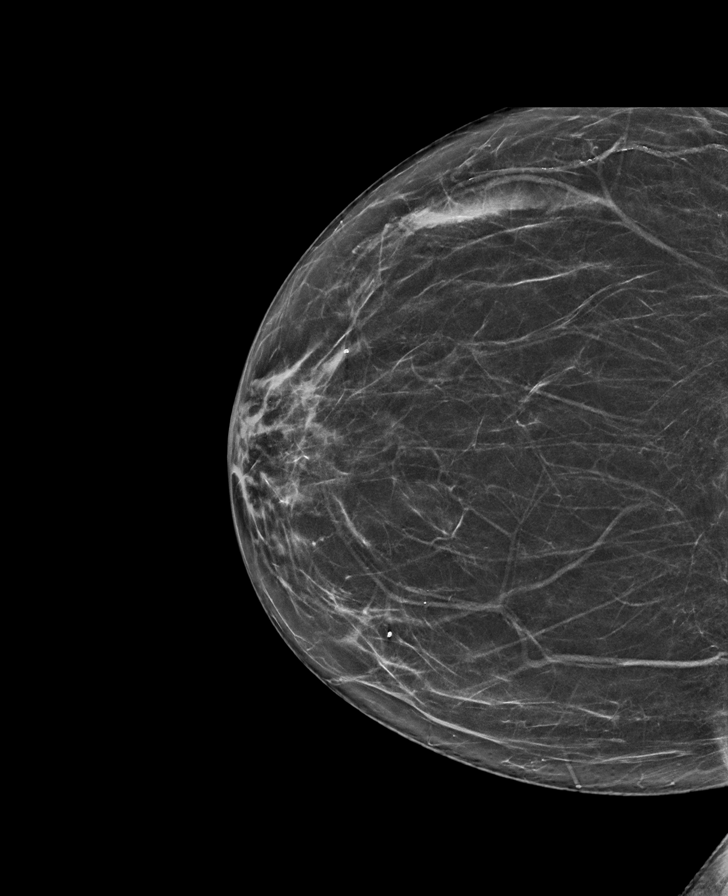

[L CC synth-2D]
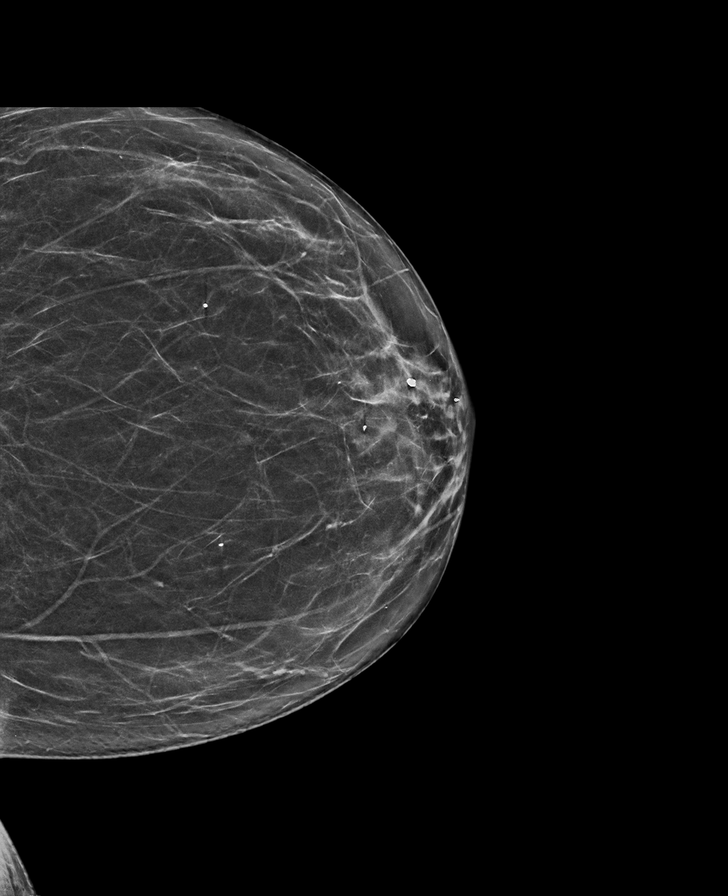

[L MLO synth-2D]
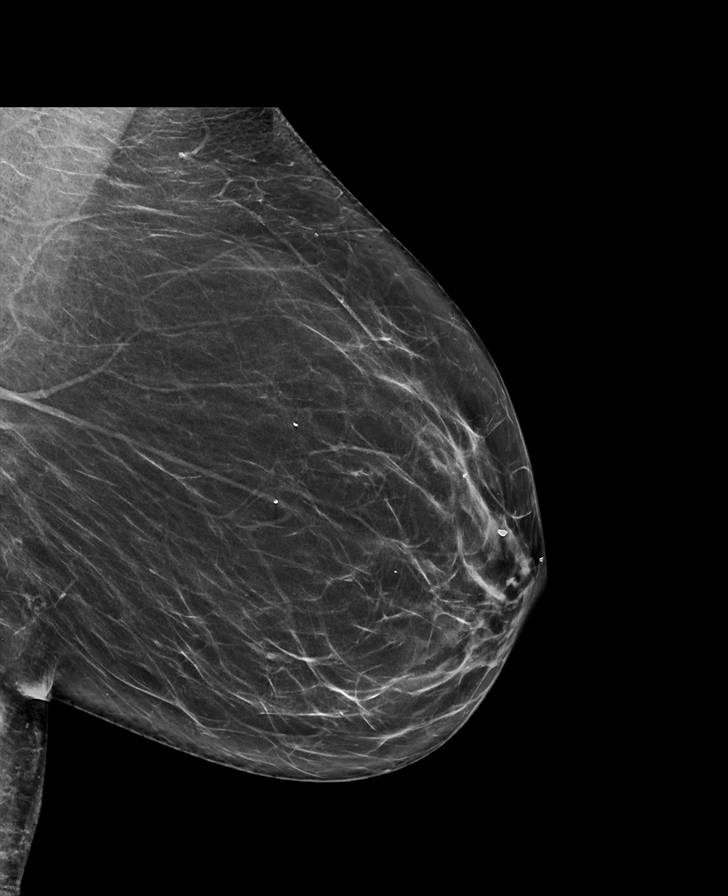

[L MLO tomo · tomo slice 37/73.0]
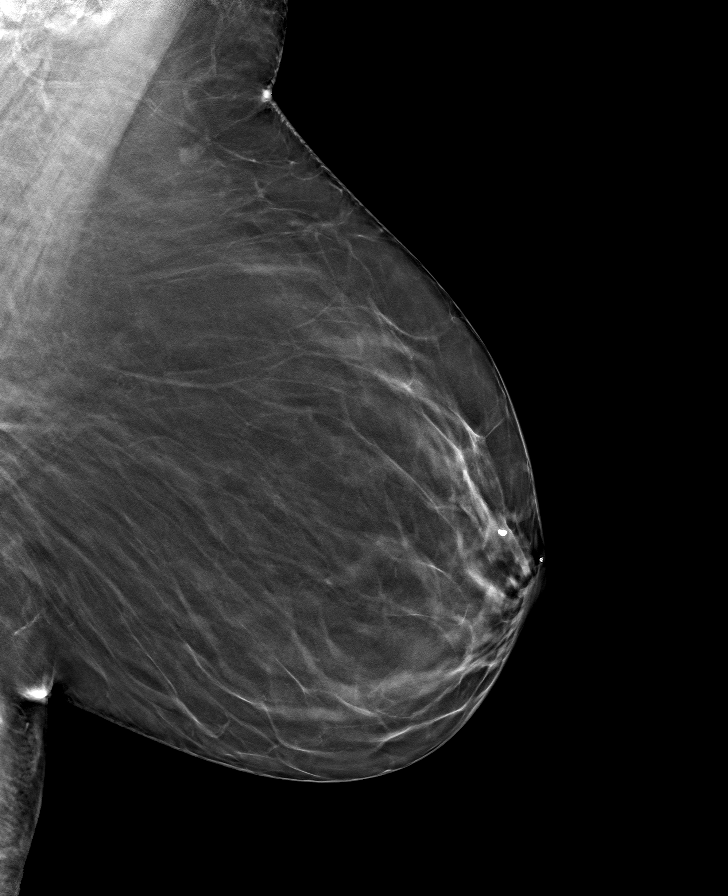

[L CC tomo · tomo slice 35/70.0]
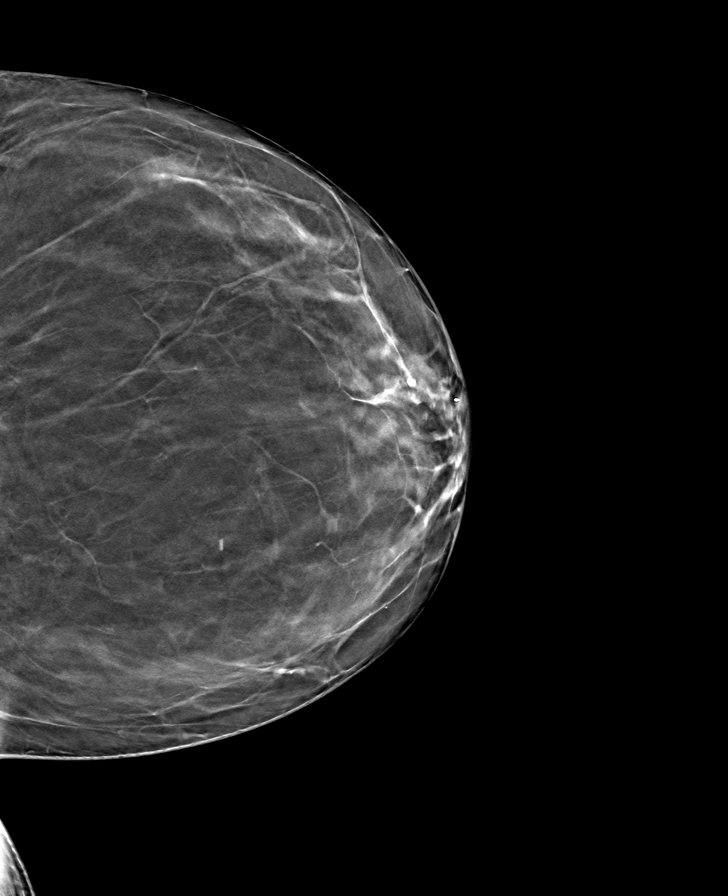

[R CC tomo · tomo slice 34/67.0]
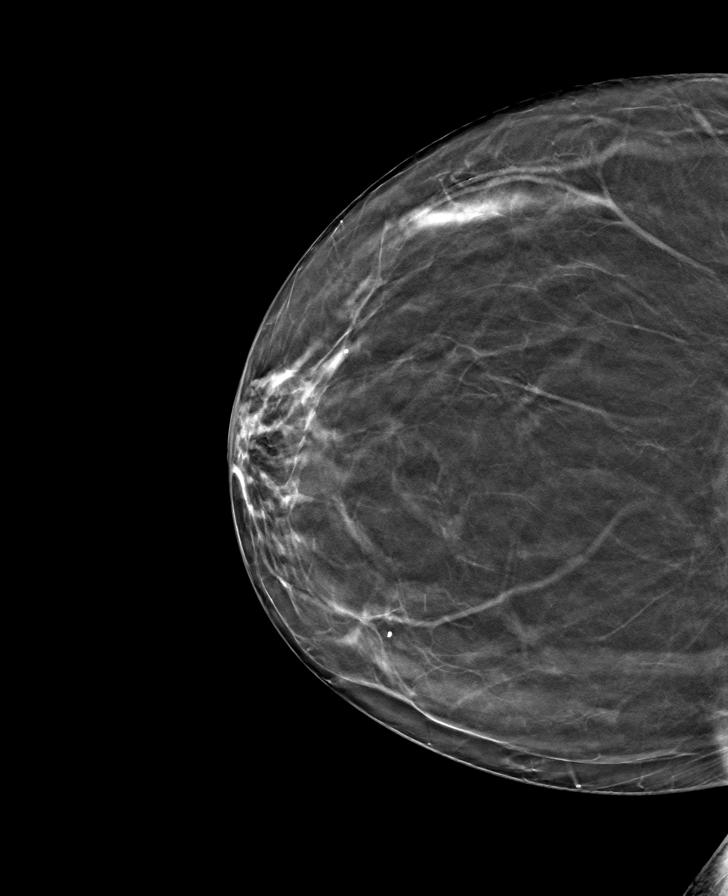

[R MLO tomo · tomo slice 37/72.0]
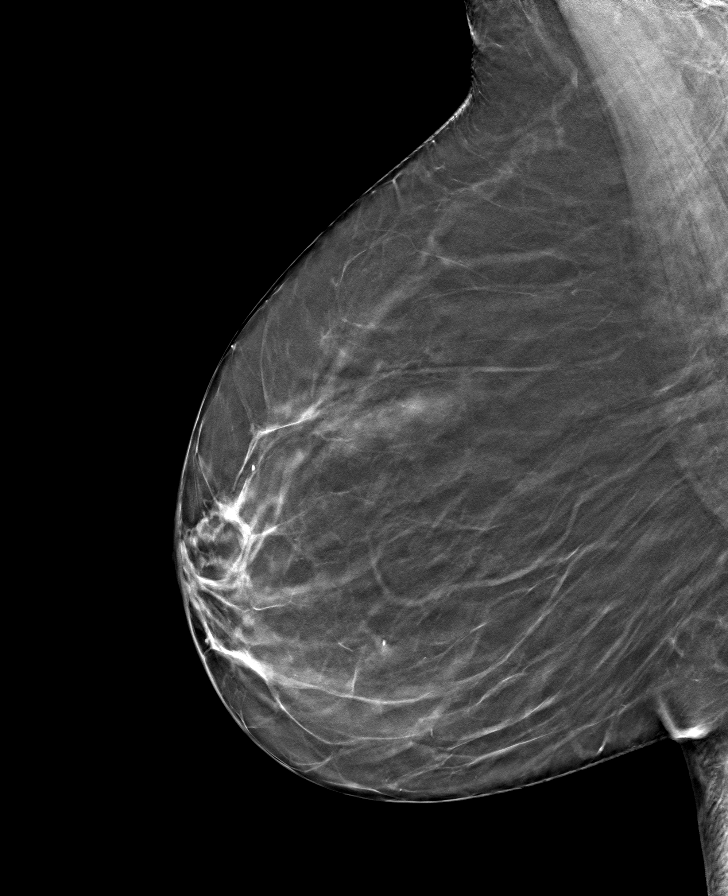

[8 of 24 positions shown; findings below may reference images not displayed]

ACR Breast Density Category b: There are scattered areas of
fibroglandular density.
FINDINGS: There are no findings suspicious for malignancy. Images were
processed with CAD.
IMPRESSION: No mammographic evidence of malignancy. A result letter of this
screening mammogram will be mailed directly to the patient.

RECOMMENDATION:
Screening mammogram in one year. (Code:[id number])

BI-RADS CATEGORY  1: Negative.

## 2021-04-28 ENCOUNTER — Encounter: Payer: Self-pay | Admitting: Family Medicine

## 2021-04-28 ENCOUNTER — Ambulatory Visit (INDEPENDENT_AMBULATORY_CARE_PROVIDER_SITE_OTHER): Payer: BC Managed Care – PPO | Admitting: Family Medicine

## 2021-04-28 ENCOUNTER — Encounter (HOSPITAL_BASED_OUTPATIENT_CLINIC_OR_DEPARTMENT_OTHER): Payer: Self-pay | Admitting: Obstetrics & Gynecology

## 2021-04-28 VITALS — BP 132/82 | HR 65 | Temp 97.7°F | Ht 64.75 in | Wt 184.9 lb

## 2021-04-28 DIAGNOSIS — Z0000 Encounter for general adult medical examination without abnormal findings: Secondary | ICD-10-CM

## 2021-04-28 DIAGNOSIS — Z86010 Personal history of colonic polyps: Secondary | ICD-10-CM | POA: Diagnosis not present

## 2021-04-28 DIAGNOSIS — Z800 Family history of malignant neoplasm of digestive organs: Secondary | ICD-10-CM

## 2021-04-28 MED ORDER — AZELASTINE-FLUTICASONE 137-50 MCG/ACT NA SUSP
2.0000 | Freq: Every day | NASAL | 11 refills | Status: DC
Start: 2021-04-28 — End: 2022-06-21

## 2021-04-28 NOTE — Progress Notes (Signed)
Rachael Munoz DOB: 1960/12/19 Encounter date: 04/28/2021  This is a 60 y.o. female who presents for complete physical   History of present illness/Additional concerns: Last visit with me was 12/2019 to establish care.   Following with dermatologist specialists: was following for spot on cheek -has used topical cream treatment. Then removed mole on left anterior leg. They are going back in on leg tomorrow to take larger sample. Saw ; seeing Link Snuffer for this.   Bp range 115/65-140/80; most in the 120's/80's. She gets checked regularly when she gives blood.   Follows with Dr. Sabra Heck for gyn care.  Mammogram 03/19/21: normal Colonoscopy: due for this; didn't hear from referral that was placed in July.  Bone density: last dexa 02/2019; per dr. Sabra Heck repeat suggested 2025 for osteopenia  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    Fracture of right wrist 2019   Hypothyroid    Ovarian cyst    right   Seasonal allergies    Past Surgical History:  Procedure Laterality Date   COLONOSCOPY W/ BIOPSIES  7/99   high grade dysplasia   COLONOSCOPY WITH PROPOFOL N/A 12/15/2015   Procedure: COLONOSCOPY WITH PROPOFOL;  Surgeon: Garlan Fair, MD;  Location: [facility];  Service: Endoscopy;  Laterality: N/A;   DILATION AND CURETTAGE OF UTERUS     miscarriage   INTRAUTERINE DEVICE (IUD) INSERTION  7/07   IUD REMOVAL  12/12   TUBAL LIGATION     No Known Allergies Current Meds  Medication Sig   Multiple Vitamins-Minerals (MULTIVITAMIN ADULT PO) Take by mouth.   [DISCONTINUED] Azelastine-Fluticasone 137-50 MCG/ACT SUSP Place 2 sprays into the nose daily.   Social History   Tobacco Use   Smoking status: Former    Packs/day: 1.50    Years: 10.00    Pack years: 15.00    Types: Cigarettes    Quit date: 10/19/1985    Years since quitting: 35.5   Smokeless tobacco: Never  Substance Use Topics    Alcohol use: Yes    Alcohol/week: 1.0 - 2.0 standard drink    Types: 1 - 2 Standard drinks or equivalent per week   Family History  Problem Relation Age of Onset   CVA Mother 68       40s, smoking, alcohol   Miscarriages / [place] Mother    Depression Mother    Arthritis Mother    Alcohol abuse Mother    Colitis Father        ostomy bag   Leukemia Father        into 29s   Colon cancer Father 30   Tuberculosis Sister        unknown how developed   Heart disease Maternal Grandfather    Diabetes Paternal Grandmother    Arthritis Paternal Grandmother    Heart disease Paternal Grandfather        heart attack   Breast cancer Neg Hx      Review of Systems  Constitutional:  Negative for activity change, appetite change, chills, fatigue, fever and unexpected weight change.  HENT:  Negative for congestion, ear pain, hearing loss, sinus pressure, sinus pain, sore throat and trouble swallowing.   Eyes:  Negative for pain and visual disturbance.  Respiratory:  Negative for cough, chest tightness, shortness of breath and wheezing.   Cardiovascular:  Negative for chest pain, palpitations and leg  swelling.  Gastrointestinal:  Negative for abdominal pain, blood in stool, constipation, diarrhea, nausea and vomiting.  Genitourinary:  Negative for difficulty urinating and menstrual problem.  Musculoskeletal:  Negative for arthralgias and back pain.  Skin:  Negative for rash.  Neurological:  Negative for dizziness, weakness, numbness and headaches.  Hematological:  Negative for adenopathy. Does not bruise/bleed easily.  Psychiatric/Behavioral:  Negative for sleep disturbance and suicidal ideas. The patient is not nervous/anxious.    CBC:  Lab Results  Component Value Date   WBC 5.6 12/09/2019   WBC 5.2 08/21/2014   HGB 13.6 12/09/2019   HCT 44.6 12/09/2019   MCH 26.8 12/09/2019   MCH 26.4 08/21/2014   MCHC 30.5 (L) 12/09/2019   MCHC 31.0 08/21/2014   RDW 17.7 (H) 12/09/2019    PLT 237 12/09/2019   MPV 11.8 08/21/2014   CMP: Lab Results  Component Value Date   NA 141 09/05/2019   K 4.6 09/05/2019   CL 104 09/05/2019   CO2 26 09/05/2019   GLUCOSE 72 09/05/2019   GLUCOSE 79 08/21/2014   BUN 19 09/05/2019   CREATININE 0.83 09/05/2019   CREATININE 0.75 08/21/2014   LABGLOB 2.6 09/05/2019   GFRAA 88 09/05/2019   CALCIUM 9.7 09/05/2019   PROT 6.9 09/05/2019   AGRATIO 1.7 09/05/2019   BILITOT 0.2 09/05/2019   ALKPHOS 74 09/05/2019   ALT 11 09/05/2019   AST 18 09/05/2019   LIPID: Lab Results  Component Value Date   CHOL 232 (H) 09/05/2019   TRIG 81 09/05/2019   HDL 65 09/05/2019   LDLCALC 153 (H) 09/05/2019   LABVLDL 14 09/05/2019    Objective:  BP 132/82 (BP Location: Left Arm, Patient Position: Sitting, Cuff Size: Large)    Pulse 65    Temp 97.7 F (36.5 C) (Oral)    Ht 5' 4.75" (1.645 m)    Wt 184 lb 14.4 oz (83.9 kg)    LMP 05/09/2006    SpO2 98%    BMI 31.01 kg/m   Weight: 184 lb 14.4 oz (83.9 kg)   BP Readings from Last 3 Encounters:  04/28/21 132/82  11/19/20 137/82  09/05/19 120/78   Wt Readings from Last 3 Encounters:  04/28/21 184 lb 14.4 oz (83.9 kg)  11/19/20 187 lb 9.6 oz (85.1 kg)  09/05/19 195 lb (88.5 kg)    Physical Exam Constitutional:      General: She is not in acute distress.    Appearance: She is well-developed.  HENT:     Head: Normocephalic and atraumatic.     Right Ear: External ear normal.     Left Ear: External ear normal.     Mouth/Throat:     Pharynx: No oropharyngeal exudate.  Eyes:     Conjunctiva/sclera: Conjunctivae normal.     Pupils: Pupils are equal, round, and reactive to light.  Neck:     Thyroid: No thyromegaly.  Cardiovascular:     Rate and Rhythm: Normal rate and regular rhythm.     Heart sounds: Normal heart sounds. No murmur heard.   No friction rub. No gallop.  Pulmonary:     Effort: Pulmonary effort is normal.     Breath sounds: Normal breath sounds.  Abdominal:     General:  Bowel sounds are normal. There is no distension.     Palpations: Abdomen is soft. There is no mass.     Tenderness: There is no abdominal tenderness. There is no guarding.     Hernia: No hernia  is present.  Musculoskeletal:        General: No tenderness or deformity. Normal range of motion.     Cervical back: Normal range of motion and neck supple.  Lymphadenopathy:     Cervical: No cervical adenopathy.  Skin:    General: Skin is warm and dry.     Findings: No rash.  Neurological:     Mental Status: She is alert and oriented to person, place, and time.     Deep Tendon Reflexes: Reflexes normal.     Reflex Scores:      Tricep reflexes are 2+ on the right side and 2+ on the left side.      Bicep reflexes are 2+ on the right side and 2+ on the left side.      Brachioradialis reflexes are 2+ on the right side and 2+ on the left side.      Patellar reflexes are 2+ on the right side and 2+ on the left side. Psychiatric:        Speech: Speech normal.        Behavior: Behavior normal.        Thought Content: Thought content normal.    Assessment/Plan: There are no preventive care reminders to display for this patient.  Health Maintenance reviewed.  1. Preventative health care Keep up with regular exercise and healthy lifestyle.  2. Family history of colon cancer She is due for colonoscopy.  Although she was referred previously by her gynecologist, she never received a phone call for scheduling.  I have reentered referral and asked her to let me know if she does not hear from them regarding scheduling. - Ambulatory referral to Gastroenterology  3. History of colonic polyps - Ambulatory referral to Gastroenterology   Return in about 1 year (around 04/28/2022).  Micheline Rough, MD

## 2021-04-28 NOTE — Patient Instructions (Signed)
Usually vitamin D suggestion would be about 1000 units daily (500 would be fine).

## 2021-06-15 ENCOUNTER — Encounter: Payer: Self-pay | Admitting: Internal Medicine

## 2021-07-12 ENCOUNTER — Telehealth: Payer: Self-pay | Admitting: Internal Medicine

## 2021-07-12 NOTE — Telephone Encounter (Signed)
Good Morning Dr. Carlean Purl ? ? ?Patient is wanting to do a transfer of care over to you for a colonoscopy. Patients last colonoscopy was 2017 at [facility]. Looks like patient needs to be seen every 5 years due to her father having colon cancer. Records are in Epic, will you please review and advise on scheduling.  ? ?Thank you.  ?

## 2021-07-13 NOTE — Telephone Encounter (Signed)
I will accept the patient ?Direct procedure ok ?

## 2021-07-23 ENCOUNTER — Ambulatory Visit (AMBULATORY_SURGERY_CENTER): Payer: BC Managed Care – PPO | Admitting: *Deleted

## 2021-07-23 ENCOUNTER — Other Ambulatory Visit: Payer: Self-pay

## 2021-07-23 ENCOUNTER — Encounter: Payer: Self-pay | Admitting: Internal Medicine

## 2021-07-23 VITALS — Ht 65.0 in | Wt 180.0 lb

## 2021-07-23 DIAGNOSIS — Z800 Family history of malignant neoplasm of digestive organs: Secondary | ICD-10-CM

## 2021-07-23 NOTE — Progress Notes (Signed)

## 2021-08-02 ENCOUNTER — Telehealth: Payer: Self-pay | Admitting: Internal Medicine

## 2021-08-02 NOTE — Telephone Encounter (Signed)
Noted  

## 2021-08-02 NOTE — Telephone Encounter (Signed)
Good Morning Dr. Carlean Purl,  ? ? ?Patient called and stated that she needed to cancel colonoscopy on 3/31 at 9:30 due to insurance reasons.    ?

## 2021-08-06 ENCOUNTER — Encounter: Payer: BC Managed Care – PPO | Admitting: Internal Medicine

## 2021-11-30 ENCOUNTER — Encounter (HOSPITAL_BASED_OUTPATIENT_CLINIC_OR_DEPARTMENT_OTHER): Payer: Self-pay | Admitting: Obstetrics & Gynecology

## 2021-11-30 ENCOUNTER — Telehealth: Payer: Self-pay

## 2021-11-30 ENCOUNTER — Ambulatory Visit (INDEPENDENT_AMBULATORY_CARE_PROVIDER_SITE_OTHER): Payer: BC Managed Care – PPO | Admitting: Obstetrics & Gynecology

## 2021-11-30 VITALS — BP 160/92 | HR 72 | Ht 63.75 in | Wt 182.8 lb

## 2021-11-30 DIAGNOSIS — Z800 Family history of malignant neoplasm of digestive organs: Secondary | ICD-10-CM | POA: Diagnosis not present

## 2021-11-30 DIAGNOSIS — Z01419 Encounter for gynecological examination (general) (routine) without abnormal findings: Secondary | ICD-10-CM | POA: Diagnosis not present

## 2021-11-30 DIAGNOSIS — Z0000 Encounter for general adult medical examination without abnormal findings: Secondary | ICD-10-CM

## 2021-11-30 DIAGNOSIS — R030 Elevated blood-pressure reading, without diagnosis of hypertension: Secondary | ICD-10-CM | POA: Diagnosis not present

## 2021-11-30 DIAGNOSIS — Z86010 Personal history of colonic polyps: Secondary | ICD-10-CM

## 2021-11-30 DIAGNOSIS — Z780 Asymptomatic menopausal state: Secondary | ICD-10-CM

## 2021-11-30 DIAGNOSIS — M8580 Other specified disorders of bone density and structure, unspecified site: Secondary | ICD-10-CM

## 2021-11-30 MED ORDER — NIFEDIPINE ER OSMOTIC RELEASE 30 MG PO TB24: 30.0000 mg | ORAL_TABLET | Freq: Every day | ORAL | 1 refills | Status: DC

## 2021-11-30 NOTE — Progress Notes (Addendum)
61 y.o. [id number] Divorced White or Caucasian female here for annual exam.  Just got back from [place] to see an [employer] concert to celebrate her oldest child's 40th birthday.  They drove so pt is feeling tired today from getting back late last night.    Patient's last menstrual period was 05/09/2006.          Sexually active: No.  The current method of family planning is post menopausal status and tubal ligation.    Exercising: Yes.     walking Smoker:  yes  Health Maintenance: Pap:  09/05/2019 Negative, neg HR HPV History of abnormal Pap:  no MMG:  03/19/2021 Negative Colonoscopy:  12/15/2015, due this year.  Is scheduled Aug 10th with [facility].   BMD:   02/25/2019 Mild Osteopenia, -1.3 Screening Labs: done with Dr. Ethlyn Gallery   reports that she quit smoking about 36 years ago. Her smoking use included cigarettes. She has a 15.00 pack-year smoking history. She has never used smokeless tobacco. She reports current alcohol use of about 1.0 - 2.0 standard drink of alcohol per week. She reports that she does not use drugs.  Past Medical History:  Diagnosis Date   Allergy    Anemia    mild-donates blood regularly   Colon polyp 11/1997   high grade dysplasia    Fracture of right wrist 2019   Hypothyroid    Melanoma ([place])    Melanoma in situ ([place])    Ovarian cyst    right   Seasonal allergies     Past Surgical History:  Procedure Laterality Date   COLONOSCOPY W/ BIOPSIES  7/99   high grade dysplasia   COLONOSCOPY WITH PROPOFOL N/A 12/15/2015   Procedure: COLONOSCOPY WITH PROPOFOL;  Surgeon: Garlan Fair, MD;  Location: [facility];  Service: Endoscopy;  Laterality: N/A;   DILATION AND CURETTAGE OF UTERUS     miscarriage   INTRAUTERINE DEVICE (IUD) INSERTION  7/07   IUD REMOVAL  12/12   TUBAL LIGATION      Current Outpatient Medications  Medication Sig Dispense Refill   Azelastine-Fluticasone 137-50 MCG/ACT SUSP Place 2 sprays into the nose daily. 23 g 11    Cholecalciferol (VITAMIN D3) 25 MCG (1000 UT) CAPS Take 25 Int'l Units by mouth daily.     Multiple Vitamins-Minerals (MULTIVITAMIN ADULT PO) Take by mouth.     No current facility-administered medications for this visit.    Family History  Problem Relation Age of Onset   CVA Mother 53       40s, smoking, alcohol   Miscarriages / [place] Mother    Depression Mother    Arthritis Mother    Alcohol abuse Mother    Colitis Father        ostomy bag   Leukemia Father        into 38s   Colon cancer Father 74   Tuberculosis Sister        unknown how developed   Heart disease Maternal Grandfather    Diabetes Paternal Grandmother    Arthritis Paternal Grandmother    Heart disease Paternal Grandfather        heart attack   Breast cancer Neg Hx    Esophageal cancer Neg Hx    Stomach cancer Neg Hx    Rectal cancer Neg Hx     ROS: Constitutional: negative Genitourinary:negative  Exam:   BP (!) 160/92   Pulse 72   Ht 5' 3.75" (1.619 m)   Wt 182 lb 12.8  oz (82.9 kg)   LMP 05/09/2006   BMI 31.62 kg/m   Height: 5' 3.75" (161.9 cm)  Initial BP in office was 164/104  General appearance: alert, cooperative and appears stated age Head: Normocephalic, without obvious abnormality, atraumatic Neck: no adenopathy, supple, symmetrical, trachea midline and thyroid normal to inspection and palpation Lungs: clear to auscultation bilaterally Breasts: normal appearance, no masses or tenderness Heart: regular rate and rhythm Abdomen: soft, non-tender; bowel sounds normal; no masses,  no organomegaly Extremities: extremities normal, atraumatic, no cyanosis or edema Skin: Skin color, texture, turgor normal. No rashes or lesions Lymph nodes: Cervical, supraclavicular, and axillary nodes normal. No abnormal inguinal nodes palpated Neurologic: Grossly normal   Pelvic: External genitalia:  no lesions              Urethra:  normal appearing urethra with no masses, tenderness or lesions               Bartholins and Skenes: normal                 Vagina: normal appearing vagina with normal color and no discharge, no lesions              Cervix: no lesions              Pap taken: No. Bimanual Exam:  Uterus:  normal size, contour, position, consistency, mobility, non-tender              Adnexa: normal adnexa and no mass, fullness, tenderness               Rectovaginal: Confirms               Anus:  normal sphincter tone, no lesions  Chaperone, Octaviano Batty, CMA, was present for exam.  Assessment/Plan: 1. Well woman exam with routine gynecological exam - Pap smear 2021 with neg HR HPV.  Not indicated today. - Mammogram 02/2021 - Colonoscopy is scheduled for August - Bone mineral density will be repeated 2-1 years - lab work ordered - vaccines reviewed/updated  2. Blood tests for routine general physical examination - CBC - Comprehensive metabolic panel - TSH - Lipid panel - Hemoglobin A1c  3. Elevated blood pressure reading - given elevated today, feel appropriate to start anti-hypertensive.  Pt's PCP is no longer here and she has not been assigned new provider.  Will reach out to [place] [facility] to get pt scheduled for follow up - NIFEdipine (PROCARDIA XL) 30 MG 24 hr tablet; Take 1 tablet (30 mg total) by mouth daily.  Dispense: 30 tablet; Refill: 1 - pt is going to get a blood pressure cuff and start checking her blood pressures as well  4. History of colonic polyps  5. Family history of colon cancer - has colonoscopy scheduled  6. Postmenopausal - no HRT  7. Osteopenia, unspecified location

## 2021-11-30 NOTE — Telephone Encounter (Signed)
Message from Hale Bogus, MD on 11/30/21: good morning.  Sorry to bother you.  the above pt was a pt of Dr. Berenice Bouton.  her BP today was 169/104 and then 160/92 after resting a short bit.  I started her on procardia but feel she needs follow up.  How do I get her scheduled more quickly for some follow up?  office manager  LVM instructions for pt to return call to schedule TOC appt with a provider.  If pt returns call, she needs an appt with a provider within the week. Please let me know if the appt is made.

## 2021-12-01 LAB — COMPREHENSIVE METABOLIC PANEL
ALT: 11 IU/L (ref 0–32)
AST: 19 IU/L (ref 0–40)
Albumin/Globulin Ratio: 1.8 (ref 1.2–2.2)
Albumin: 4.2 g/dL (ref 3.9–4.9)
Alkaline Phosphatase: 67 IU/L (ref 44–121)
BUN/Creatinine Ratio: 21 (ref 12–28)
BUN: 16 mg/dL (ref 8–27)
Bilirubin Total: 0.3 mg/dL (ref 0.0–1.2)
CO2: 24 mmol/L (ref 20–29)
Calcium: 9.5 mg/dL (ref 8.7–10.3)
Chloride: 104 mmol/L (ref 96–106)
Creatinine, Ser: 0.77 mg/dL (ref 0.57–1.00)
Globulin, Total: 2.3 g/dL (ref 1.5–4.5)
Glucose: 80 mg/dL (ref 70–99)
Potassium: 4.1 mmol/L (ref 3.5–5.2)
Sodium: 142 mmol/L (ref 134–144)
Total Protein: 6.5 g/dL (ref 6.0–8.5)
eGFR: 87 mL/min/{1.73_m2} (ref >59)

## 2021-12-01 LAB — CBC
Hematocrit: 36.3 % (ref 34.0–46.6)
Hemoglobin: 11 g/dL — ABNORMAL LOW (ref 11.1–15.9)
MCH: 24.9 pg — ABNORMAL LOW (ref 26.6–33.0)
MCHC: 30.3 g/dL — ABNORMAL LOW (ref 31.5–35.7)
MCV: 82 fL (ref 79–97)
Platelets: 272 10*3/uL (ref 150–450)
RBC: 4.41 x10E6/uL (ref 3.77–5.28)
RDW: 17.6 % — ABNORMAL HIGH (ref 11.7–15.4)
WBC: 4.7 10*3/uL (ref 3.4–10.8)

## 2021-12-01 LAB — LIPID PANEL
Chol/HDL Ratio: 4.1 ratio (ref 0.0–4.4)
Cholesterol, Total: 228 mg/dL — ABNORMAL HIGH (ref 100–199)
HDL: 55 mg/dL (ref >39)
LDL Chol Calc (NIH): 160 mg/dL — ABNORMAL HIGH (ref 0–99)
Triglycerides: 77 mg/dL (ref 0–149)
VLDL Cholesterol Cal: 13 mg/dL (ref 5–40)

## 2021-12-01 LAB — HEMOGLOBIN A1C
Est. average glucose Bld gHb Est-mCnc: 111 mg/dL
Hgb A1c MFr Bld: 5.5 % (ref 4.8–5.6)

## 2021-12-01 LAB — TSH: TSH: 3.47 u[IU]/mL (ref 0.450–4.500)

## 2021-12-06 NOTE — Telephone Encounter (Signed)
Pt has TOC appt with Dr Legrand Como on 12/14/21 at patient's earliest convenience.

## 2021-12-08 ENCOUNTER — Encounter (HOSPITAL_BASED_OUTPATIENT_CLINIC_OR_DEPARTMENT_OTHER): Payer: Self-pay | Admitting: Obstetrics & Gynecology

## 2021-12-14 ENCOUNTER — Encounter: Payer: Self-pay | Admitting: Family Medicine

## 2021-12-14 ENCOUNTER — Ambulatory Visit: Payer: BC Managed Care – PPO | Admitting: Family Medicine

## 2021-12-14 VITALS — BP 118/82 | HR 84 | Temp 97.5°F | Ht 63.75 in | Wt 175.9 lb

## 2021-12-14 DIAGNOSIS — E785 Hyperlipidemia, unspecified: Secondary | ICD-10-CM | POA: Insufficient documentation

## 2021-12-14 DIAGNOSIS — D500 Iron deficiency anemia secondary to blood loss (chronic): Secondary | ICD-10-CM | POA: Diagnosis not present

## 2021-12-14 DIAGNOSIS — E782 Mixed hyperlipidemia: Secondary | ICD-10-CM | POA: Diagnosis not present

## 2021-12-14 DIAGNOSIS — I10 Essential (primary) hypertension: Secondary | ICD-10-CM | POA: Insufficient documentation

## 2021-12-14 NOTE — Patient Instructions (Addendum)
Continue to check blood pressure every day. Send me your readings over MyChart in about 2-3 weeks.   Your goal blood pressure is anything less than 140/90.

## 2021-12-14 NOTE — Assessment & Plan Note (Addendum)
Secondary to donating blood every 8 weeks, she continues to take daily iron supplements to help replenish her iron stores. Last Hb was 11, it is advised that she wait a few months before donating her blood again.

## 2021-12-14 NOTE — Assessment & Plan Note (Signed)
BP is controlled in the office today. I advised she continue the nifedipine 30 mg daily and continue to check her BP at home daily. I recommended she send me her readings of her BP in about 2-3 weeks over MyChart. I advised she get a BP cuff that measures over her upper arm rather than using the wrist cuff she currently has. We went over the proper technique for checking BP also.

## 2021-12-14 NOTE — Assessment & Plan Note (Signed)
LDL is elevated on her last set of labs, however her 10 year CVD risk score is only 5.6 %. Medication not necessarily indicated at this time. Will continue to monitor her lipid panel yearly

## 2021-12-14 NOTE — Progress Notes (Signed)
Established Patient Office Visit  Subjective   Patient ID: Rachael Munoz, female    DOB: [birth year]  Age: 61 y.o. MRN: [id number]  Chief Complaint  Patient presents with   Establish Care    Patient is here to establish care-- states that she has a history of low iron due to the fact that she donates blood every 8 weeks. States that when she returned from vacation last week and her blood pressure was very high. States she saw her gynecologist and had her labs done. She was placed on nifedipine 30 mg daily and has been checking her BP daily at home, but thinks that the wrist cuff she is using is not accurate.   Is having a colonoscopy on Thursday, states her father had colon cancer so she goes every 5 years for surveillance. Is up to date on her health maintenance measures.   HTN -- this is a new diagnosis-- she reports she was recently placed on nifedipine once daily and her BP today is doing well. We reviewed her bloodwork from last month, I interpretted the results of this and gave her recommendations on her vitamin supplements.    Patient Active Problem List   Diagnosis Date Noted   HTN (hypertension) 12/14/2021   HLD (hyperlipidemia) 12/14/2021   Osteopenia 11/30/2021   Family history of colon cancer 02/16/2017   History of colonic polyps 07/17/2014   Hypothyroidism 07/17/2014   Anemia 07/17/2014      Review of Systems  Eyes:  Negative for blurred vision.  Respiratory:  Negative for shortness of breath.   Cardiovascular:  Negative for chest pain and leg swelling.  All other systems reviewed and are negative.     Objective:     BP 118/82 (BP Location: Left Arm, Patient Position: Sitting, Cuff Size: Large)   Pulse 84   Temp (!) 97.5 F (36.4 C) (Oral)   Ht 5' 3.75" (1.619 m)   Wt 175 lb 14.4 oz (79.8 kg)   LMP 05/09/2006   SpO2 96%   BMI 30.43 kg/m  BP Readings from Last 3 Encounters:  12/14/21 118/82  11/30/21 (!) 160/92  04/28/21 132/82      Physical  Exam Vitals reviewed.  Constitutional:      Appearance: Normal appearance. She is well-groomed and normal weight.  HENT:     Head: Normocephalic and atraumatic.     Mouth/Throat:     Mouth: Mucous membranes are moist.     Pharynx: Oropharynx is clear.  Eyes:     Extraocular Movements: Extraocular movements intact.     Conjunctiva/sclera: Conjunctivae normal.     Pupils: Pupils are equal, round, and reactive to light.  Cardiovascular:     Rate and Rhythm: Normal rate and regular rhythm.     Pulses: Normal pulses.     Heart sounds: S1 normal and S2 normal.  Pulmonary:     Effort: Pulmonary effort is normal.     Breath sounds: Normal breath sounds and air entry.  Abdominal:     General: Abdomen is flat. Bowel sounds are normal.     Palpations: Abdomen is soft.  Musculoskeletal:        General: Normal range of motion.     Cervical back: Normal range of motion and neck supple.     Right lower leg: No edema.     Left lower leg: No edema.  Skin:    General: Skin is warm and dry.  Neurological:     Mental Status:  She is alert and oriented to person, place, and time. Mental status is at baseline.     Gait: Gait is intact.  Psychiatric:        Mood and Affect: Mood and affect normal.        Speech: Speech normal.        Behavior: Behavior normal.        Judgment: Judgment normal.      No results found for any visits on 12/14/21.  Last CBC Lab Results  Component Value Date   WBC 4.7 11/30/2021   HGB 11.0 (L) 11/30/2021   HCT 36.3 11/30/2021   MCV 82 11/30/2021   MCH 24.9 (L) 11/30/2021   RDW 17.6 (H) 11/30/2021   PLT 272 16/02/9603   Last metabolic panel Lab Results  Component Value Date   GLUCOSE 80 11/30/2021   NA 142 11/30/2021   K 4.1 11/30/2021   CL 104 11/30/2021   CO2 24 11/30/2021   BUN 16 11/30/2021   CREATININE 0.77 11/30/2021   EGFR 87 11/30/2021   CALCIUM 9.5 11/30/2021   PROT 6.5 11/30/2021   ALBUMIN 4.2 11/30/2021   LABGLOB 2.3 11/30/2021    AGRATIO 1.8 11/30/2021   BILITOT 0.3 11/30/2021   ALKPHOS 67 11/30/2021   AST 19 11/30/2021   ALT 11 11/30/2021   Last lipids Lab Results  Component Value Date   CHOL 228 (H) 11/30/2021   HDL 55 11/30/2021   LDLCALC 160 (H) 11/30/2021   TRIG 77 11/30/2021   CHOLHDL 4.1 11/30/2021   Last hemoglobin A1c Lab Results  Component Value Date   HGBA1C 5.5 11/30/2021   Last thyroid functions Lab Results  Component Value Date   TSH 3.470 11/30/2021      The 10-year ASCVD risk score (Arnett DK, et al., 2019) is: 5.6%    Assessment & Plan:   Problem List Items Addressed This Visit       Cardiovascular and Mediastinum   HTN (hypertension)    BP is controlled in the office today. I advised she continue the nifedipine 30 mg daily and continue to check her BP at home daily. I recommended she send me her readings of her BP in about 2-3 weeks over MyChart. I advised she get a BP cuff that measures over her upper arm rather than using the wrist cuff she currently has. We went over the proper technique for checking BP also.        Other   Anemia - Primary    Secondary to donating blood every 8 weeks, she continues to take daily iron supplements to help replenish her iron stores. Last Hb was 11, it is advised that she wait a few months before donating her blood again.      Relevant Medications   Ferrous Sulfate (IRON PO)   HLD (hyperlipidemia) (Chronic)    LDL is elevated on her last set of labs, however her 10 year CVD risk score is only 5.6 %. Medication not necessarily indicated at this time. Will continue to monitor her lipid panel yearly       Return in about 6 months (around 06/16/2022) for HTN follow up.    Farrel Conners, MD

## 2022-01-04 MED ORDER — LOSARTAN POTASSIUM 25 MG PO TABS: 25.0000 mg | ORAL_TABLET | Freq: Every day | ORAL | 1 refills | Status: DC

## 2022-01-04 NOTE — Telephone Encounter (Signed)
Noted  

## 2022-02-07 ENCOUNTER — Other Ambulatory Visit: Payer: Self-pay | Admitting: Family Medicine

## 2022-02-07 DIAGNOSIS — Z1231 Encounter for screening mammogram for malignant neoplasm of breast: Secondary | ICD-10-CM

## 2022-03-22 ENCOUNTER — Ambulatory Visit
Admission: RE | Admit: 2022-03-22 | Discharge: 2022-03-22 | Disposition: A | Discharge status: Home / Self Care | Payer: BC Managed Care – PPO | Source: Ambulatory Visit | Attending: Family Medicine | Admitting: Family Medicine

## 2022-03-22 DIAGNOSIS — Z1231 Encounter for screening mammogram for malignant neoplasm of breast: Secondary | ICD-10-CM

## 2022-03-23 NOTE — Progress Notes (Signed)
Normal mammo, follow up yearly

## 2022-06-21 ENCOUNTER — Ambulatory Visit (INDEPENDENT_AMBULATORY_CARE_PROVIDER_SITE_OTHER): Payer: BC Managed Care – PPO | Admitting: Family Medicine

## 2022-06-21 VITALS — BP 120/60 | HR 70 | Temp 98.4°F | Ht 63.75 in | Wt 181.2 lb

## 2022-06-21 DIAGNOSIS — S86911A Strain of unspecified muscle(s) and tendon(s) at lower leg level, right leg, initial encounter: Secondary | ICD-10-CM

## 2022-06-21 DIAGNOSIS — I10 Essential (primary) hypertension: Secondary | ICD-10-CM

## 2022-06-21 DIAGNOSIS — J310 Chronic rhinitis: Secondary | ICD-10-CM | POA: Diagnosis not present

## 2022-06-21 MED ORDER — AZELASTINE-FLUTICASONE 137-50 MCG/ACT NA SUSP: 2.0000 | Freq: Every day | NASAL | 11 refills | Status: DC

## 2022-06-21 MED ORDER — LOSARTAN POTASSIUM 25 MG PO TABS: 25.0000 mg | ORAL_TABLET | Freq: Every day | ORAL | 1 refills | Status: DC

## 2022-06-21 NOTE — Progress Notes (Unsigned)
Established Patient Office Visit  Subjective   Patient ID: Rachael Munoz, female    DOB: 31-Jan-1961  Age: 62 y.o. MRN: [id number]  Chief Complaint  Patient presents with   Medical Management of Chronic Issues    Paitent is here for follow up   Patient reports that for 3 weeks she has had pain down the right leg. States that she was putting away [employer] and doing some outside yard work and thought she might just be sore. States that she has been using voltaren gel  which does help with the pain. States    Current Outpatient Medications  Medication Instructions   Azelastine-Fluticasone 137-50 MCG/ACT SUSP 2 sprays, Nasal, Daily   Cholecalciferol (VITAMIN D) 50 MCG (2000 UT) CAPS 2 each, Oral, Daily   losartan (COZAAR) 25 mg, Oral, Daily   Multiple Vitamins-Minerals (MULTIVITAMIN ADULT PO) 2 tablets, Oral, Daily    Patient Active Problem List   Diagnosis Date Noted   HTN (hypertension) 12/14/2021   HLD (hyperlipidemia) 12/14/2021   Osteopenia 11/30/2021   Family history of colon cancer 02/16/2017   History of colonic polyps 07/17/2014   Hypothyroidism 07/17/2014   Anemia 07/17/2014      Review of Systems  All other systems reviewed and are negative.     Objective:     BP 120/60 (BP Location: Left Arm, Patient Position: Sitting, Cuff Size: Large)   Pulse 70   Temp 98.4 F (36.9 C) (Oral)   Ht 5' 3.75" (1.619 m)   Wt 181 lb 3.2 oz (82.2 kg)   LMP 05/09/2006   SpO2 99%   BMI 31.35 kg/m  {Vitals History (Optional):23777}  Physical Exam Vitals reviewed.  Constitutional:      Appearance: Normal appearance. She is well-groomed and normal weight.  Eyes:     Conjunctiva/sclera: Conjunctivae normal.  Neck:     Thyroid: No thyromegaly.  Cardiovascular:     Rate and Rhythm: Normal rate and regular rhythm.     Pulses: Normal pulses.     Heart sounds: S1 normal and S2 normal.  Pulmonary:     Effort: Pulmonary effort is normal.     Breath sounds:  Normal breath sounds and air entry.  Abdominal:     General: Bowel sounds are normal.  Musculoskeletal:     Right lower leg: No edema.     Left lower leg: No edema.  Neurological:     Mental Status: She is alert and oriented to person, place, and time. Mental status is at baseline.     Gait: Gait is intact.  Psychiatric:        Mood and Affect: Mood and affect normal.        Speech: Speech normal.        Behavior: Behavior normal.        Judgment: Judgment normal.      No results found for any visits on 06/21/22.  {Labs (Optional):23779}  The 10-year ASCVD risk score (Arnett DK, et al., 2019) is: 5.8%    Assessment & Plan:   Problem List Items Addressed This Visit       Unprioritized   HTN (hypertension)   Relevant Medications   losartan (COZAAR) 25 MG tablet   Other Visit Diagnoses     Chronic rhinitis    -  Primary   Relevant Medications   Azelastine-Fluticasone 137-50 MCG/ACT SUSP       Return in about 6 months (around 12/20/2022) for HTN.    Royston Cowper  Legrand Como, MD

## 2022-06-23 DIAGNOSIS — J310 Chronic rhinitis: Secondary | ICD-10-CM | POA: Insufficient documentation

## 2022-06-23 NOTE — Assessment & Plan Note (Signed)
Will refill her azelastine/fluticasone spray, 1 spray per nostril daily.

## 2022-06-23 NOTE — Assessment & Plan Note (Addendum)
Current hypertension medications:       Sig   losartan (COZAAR) 25 MG tablet Take 1 tablet (25 mg total) by mouth daily.     BP is well controlled today on the losartan 25 mg daily, swelling resolved since being off the nifedipine. will continue this medications as prescribed.

## 2022-07-12 ENCOUNTER — Telehealth: Payer: Self-pay | Admitting: Family Medicine

## 2022-07-12 DIAGNOSIS — M79605 Pain in left leg: Secondary | ICD-10-CM

## 2022-07-12 MED ORDER — METHYLPREDNISOLONE 4 MG PO TBPK: ORAL_TABLET | ORAL | 0 refills | Status: DC

## 2022-07-12 NOTE — Addendum Note (Signed)
Addended by: Farrel Conners on: 07/12/2022 04:53 PM   Modules accepted: Orders

## 2022-07-12 NOTE — Telephone Encounter (Signed)
Leg cramps/pain right leg, steroids were suggested. Patient states the pain is not getting any better and she would like to start the steroids and info on taking them. Please advise

## 2022-07-12 NOTE — Telephone Encounter (Signed)
Script sent  

## 2022-07-13 NOTE — Telephone Encounter (Signed)
Error below-patient informed of the message below.

## 2022-07-13 NOTE — Telephone Encounter (Signed)
Patient informed of the results

## 2022-08-10 ENCOUNTER — Telehealth: Payer: Self-pay | Admitting: Family Medicine

## 2022-08-10 DIAGNOSIS — M79605 Pain in left leg: Secondary | ICD-10-CM

## 2022-08-10 DIAGNOSIS — M5431 Sciatica, right side: Secondary | ICD-10-CM

## 2022-08-10 NOTE — Telephone Encounter (Signed)
Pt was given a steriod pack Rx a few wks ago for px in leg. Pt stated that px is back in lft thigh in butt area through calf. Was told by Dr if px is consistent then she would need a MRI. 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  Pt would like a call back or mychart msg sent to see what the Dr suggests.   Please advise.

## 2022-08-10 NOTE — Telephone Encounter (Signed)
Spoke with the patient and informed her of the message below.  Message sent to PCP as patient agreed to the referral and is aware someone will contact her with appt info.

## 2022-08-10 NOTE — Telephone Encounter (Signed)
I can place a referral to sports medicine for the injection -- I cannot do them. If she would like let me know and I will place the referral

## 2022-08-11 NOTE — Addendum Note (Signed)
Addended by: Farrel Conners on: 08/11/2022 08:45 AM   Modules accepted: Orders

## 2022-08-18 ENCOUNTER — Ambulatory Visit (INDEPENDENT_AMBULATORY_CARE_PROVIDER_SITE_OTHER): Payer: BC Managed Care – PPO

## 2022-08-18 ENCOUNTER — Ambulatory Visit: Payer: BC Managed Care – PPO | Admitting: Family Medicine

## 2022-08-18 VITALS — BP 122/86 | HR 76 | Ht 63.0 in | Wt 184.0 lb

## 2022-08-18 DIAGNOSIS — M545 Low back pain, unspecified: Secondary | ICD-10-CM

## 2022-08-18 DIAGNOSIS — G8929 Other chronic pain: Secondary | ICD-10-CM

## 2022-08-18 DIAGNOSIS — M25551 Pain in right hip: Secondary | ICD-10-CM | POA: Diagnosis not present

## 2022-08-18 MED ORDER — GABAPENTIN 100 MG PO CAPS
100.0000 mg | ORAL_CAPSULE | Freq: Every day | ORAL | 3 refills | Status: DC
Start: 2022-08-18 — End: 2023-06-30

## 2022-08-18 NOTE — Patient Instructions (Addendum)
Thank you for coming in today.   You received an injection today. Seek immediate medical attention if the joint becomes red, extremely painful, or is oozing fluid.   Please get an Xray today before you leave   I've referred you to Physical Therapy.  Let us know if you don't hear from them in one week.   Check back in 6 weeks 

## 2022-08-18 NOTE — Progress Notes (Signed)
IPhilbert Riser, PhD, LAT, ATC acting as a scribe for Clementeen Graham, MD.  Subjective:    CC: R flank pain  HPI: Pt is a 62 y/o female who presents to clinic today for pain in the right posterior lateral hip extending down the right leg to the lateral calf and into the foot.  Pain is worse with activity and better with rest.  She denies any injury that may have caused this.  She tried acetaminophen which helps some.   Pertinent review of Systems: No fevers or chills  Relevant historical information: Hypertension   Objective:    Vitals:   08/18/22 1523  BP: 122/86  Pulse: 76  SpO2: 96%   General: Well Developed, well nourished, and in no acute distress.   MSK: L-spine: Normal appearing Nontender palpation spinal midline. Normal lumbar motion. Lower extremity strength is intact except noted below  Right hip: Normal-appearing Tender palpation at greater trochanter and posterior buttocks. Normal hip motion. Positive FABER test. Hip abduction and external rotation strength are diminished 4/5 both producing pain.  Lab and Radiology Results  Hip greater trochanteric injection: Right Consent obtained and timeout performed. Area of maximum tenderness palpated and identified. Skin cleaned with alcohol, cold spray applied. A 22-gauge needle was used to access the greater trochanteric bursa.  of Kenalog and 2 mL of Marcaine were used to inject the trochanteric bursa. Patient tolerated the procedure well.   X-ray images L-spine and right hip obtained today personally and independently interpreted  L-spine: DDD and facet DJD present L4-5 and L5-S1.  No acute fractures are present.  Right hip: No acute fractures present.  No severe hip DJD.  Await formal radiology review    Impression and Recommendations:    Assessment and Plan: 62 y.o. female with right lateral hip pain thought to be due to trochanteric bursitis.  She also has pain radiating down her right leg  concerning for L5 lumbar radiculopathy.  Piriformis syndrome is a possibility as well.  Plan to treat with greater trochanter injection as noted above, gabapentin, and referral to physical therapy.  Recheck in about 6 weeks.  PDMP not reviewed this encounter. Orders Placed This Encounter  Procedures   DG HIP UNILAT W OR W/O PELVIS 2-3 VIEWS RIGHT    Standing Status:   Future    Number of Occurrences:   1    Standing Expiration Date:   09/17/2022    Order Specific Question:   Reason for Exam (SYMPTOM  OR DIAGNOSIS REQUIRED)    Answer:   right hip pain    Order Specific Question:   Preferred imaging location?    Answer:   [facility]   DG Lumbar Spine 2-3 Views    Standing Status:   Future    Number of Occurrences:   1    Standing Expiration Date:   09/17/2022    Order Specific Question:   Reason for Exam (SYMPTOM  OR DIAGNOSIS REQUIRED)    Answer:   low back pain    Order Specific Question:   Preferred imaging location?    Answer:   [facility]   Ambulatory referral to Physical Therapy    Referral Priority:   Routine    Referral Type:   Physical Medicine    Referral Reason:   Specialty Services Required    Requested Specialty:   Physical Therapy    Number of Visits Requested:   1   Meds ordered this encounter  Medications  gabapentin (NEURONTIN) 100 MG capsule    Sig: Take 1 capsule (100 mg total) by mouth at bedtime. 100-300 mg daily at bedtime.    Dispense:  30 capsule    Refill:  3    Discussed warning signs or symptoms. Please see discharge instructions. Patient expresses understanding.   The above documentation has been reviewed and is accurate and complete Clementeen Graham, M.D.

## 2022-08-22 NOTE — Progress Notes (Signed)
Lumbar spine x-ray shows some mild arthritis changes

## 2022-08-22 NOTE — Progress Notes (Signed)
Right hip x-ray shows a little bit of arthritis at the pubic symphysis and some tilting of your pelvis which is not a surprise if the muscles and tendons on the side of the hip are weak and painful. Physical therapy should help.

## 2022-08-30 ENCOUNTER — Encounter: Payer: Self-pay | Admitting: Physical Therapy

## 2022-08-30 ENCOUNTER — Ambulatory Visit: Payer: BC Managed Care – PPO | Admitting: Physical Therapy

## 2022-08-30 DIAGNOSIS — M79661 Pain in right lower leg: Secondary | ICD-10-CM

## 2022-08-30 DIAGNOSIS — M6281 Muscle weakness (generalized): Secondary | ICD-10-CM | POA: Diagnosis not present

## 2022-08-30 NOTE — Therapy (Signed)
OUTPATIENT PHYSICAL THERAPY THORACOLUMBAR EVALUATION   Patient Name: Rachael Munoz MRN: [id number] DOB:09-13-1960, 62 y.o., female Today's Date: 08/30/2022  END OF SESSION:  PT End of Session - 08/30/22 1008     Visit Number 1    Number of Visits 8    Date for PT Re-Evaluation 10/25/22    Authorization Type BCBS VL 30 co pay $80    Authorization - Visit Number 1    Authorization - Number of Visits 30    Progress Note Due on Visit 10    PT Start Time 1017    PT Stop Time 1049    PT Time Calculation (min) 32 min    Activity Tolerance Patient tolerated treatment well    Behavior During Therapy [facility] for tasks assessed/performed             Past Medical History:  Diagnosis Date   Allergy    Anemia    mild-donates blood regularly   Colon polyp 11/1997   high grade dysplasia    Fracture of right wrist 2019   Hypothyroid    Melanoma    Melanoma in situ    Ovarian cyst    right   Seasonal allergies    Past Surgical History:  Procedure Laterality Date   COLONOSCOPY W/ BIOPSIES  7/99   high grade dysplasia   COLONOSCOPY WITH PROPOFOL N/A 12/15/2015   Procedure: COLONOSCOPY WITH PROPOFOL;  Surgeon: Charolett Bumpers, MD;  Location: [facility];  Service: Endoscopy;  Laterality: N/A;   DILATION AND CURETTAGE OF UTERUS     miscarriage   INTRAUTERINE DEVICE (IUD) INSERTION  7/07   IUD REMOVAL  12/12   TUBAL LIGATION     Patient Active Problem List   Diagnosis Date Noted   Chronic rhinitis 06/23/2022   HTN (hypertension) 12/14/2021   HLD (hyperlipidemia) 12/14/2021   Osteopenia 11/30/2021   Family history of colon cancer 02/16/2017   History of colonic polyps 07/17/2014   Hypothyroidism 07/17/2014   Anemia 07/17/2014    PCP: Karie Georges, MD  REFERRING PROVIDER: Rodolph Bong, MD  REFERRING DIAG: [phone] (ICD-10-CM) - Chronic right-sided low back pain without sciatica  Rationale for Evaluation and Treatment: Rehabilitation  THERAPY DIAG:  Pain  in right lower leg - Plan: PT plan of care cert/re-cert  Muscle weakness (generalized) - Plan: PT plan of care cert/re-cert  ONSET DATE: Jan 2024  SUBJECTIVE:  SUBJECTIVE STATEMENT: States she was on a ladder and she missed the bottom step and landed hard and she limping around for a while and then she got her shot last week and she feels so much better. States that she hasn't been able to do much because of the pain and was cramping a lot at night. States that she tried the gabapetin and has used it 2x. Overall much better since the injection. States she has been walking the dog and starting to work out and is feeling much better.  Was putting away Christmas decorations and outside yard work. States that her pain was cramping at night but no longer.  PERTINENT HISTORY:  HTN, right wrist fracture  PAIN:  Are you having pain? Yes: NPRS scale: 0 current, prior to injection 9/10 Pain location: right hip/leg down the back of the leg Pain description: sharp, shooting, dull Aggravating factors: getting up and down, walking Relieving factors: rest, injection, Voltaren gel  PRECAUTIONS: None  WEIGHT BEARING RESTRICTIONS: No  FALLS:  Has patient fallen in last 6 months? No   OCCUPATION: Diplomatic Services operational officer at [employer]).  PLOF: Independent 1/2 hour walking dog, up and down on her feet all day.  PATIENT GOALS: To be able to function without pain in right lower extremity   OBJECTIVE:    SCREENING FOR RED FLAGS: Bowel or bladder incontinence: No Spinal tumors: No Cauda equina syndrome: No Compression fracture: No Abdominal aneurysm: No  COGNITION: Overall cognitive status: Within functional limits for tasks assessed     SENSATION: Not tested    POSTURE: rounded shoulders and anterior  pelvic tilt  PALPATION: Tenderness to palpation along right glutes   LUMBAR ROM: WNL in all directions no pain noted      LE Measurements Lower Extremity Right 08/30/2022 Left 08/30/2022   A/PROM MMT A/PROM MMT  Hip Flexion WFL 3+ WFL 4+  Hip Extension WFL 3+ WFL 4-  Hip Abduction  3+  4-  Hip Adduction      Hip Internal rotation WFL*  WFL   Hip External rotation WFL*  WFL   Knee Flexion  4-  4  Knee Extension  4  4+  Ankle Dorsiflexion  4+  4+  Ankle Plantarflexion      Ankle Inversion      Ankle Eversion       (Blank rows = not tested) * slight increase in muscle resistance/tone   LUMBAR SPECIAL TESTS:  Straight leg raise test: Negative and Slump test: Negative  TODAY'S TREATMENT:                                                                                                                              DATE:   08/30/2022  Therapeutic Exercise:  Aerobic: Supine:s/l clams and reverse clams x10 B and each, bridges x10 B Prone:  Seated: piriformis stretch x1 30" holds IR and ER each and B  Standing: Neuromuscular Re-education: Manual Therapy: Therapeutic  Activity: Self Care: Trigger Point Dry Needling:  Modalities:    PATIENT EDUCATION:  Education details: on current presentation, on HEP, on POC Person educated: Patient Education method: Programmer, multimedia, Demonstration, and Handouts Education comprehension: verbalized understanding   HOME EXERCISE PROGRAM: [id number]  ASSESSMENT:  CLINICAL IMPRESSION: Patient presents to physical therapy with complaints of right hip and lower extremity pain that has significantly improved since recent injection.  Patient does present with weakness and range of motion deficits in bilateral lower extremities with right worse than left.  Session focused on education and establishing home exercise program for patient to work on moving forward.  Patient would greatly benefit from skilled physical therapy at this time to reduce overall  risk of reinjury and improve overall function and quality of life.  OBJECTIVE IMPAIRMENTS: decreased mobility, decreased ROM, decreased strength, and pain.   ACTIVITY LIMITATIONS: standing and locomotion level  PARTICIPATION LIMITATIONS: community activity and occupation  PERSONAL FACTORS: Age and Fitness are also affecting patient's functional outcome.   REHAB POTENTIAL: Excellent  CLINICAL DECISION MAKING: Stable/uncomplicated  EVALUATION COMPLEXITY: Low   GOALS: Goals reviewed with patient? yes  SHORT TERM GOALS: Target date: 09/27/2022  Patient will be independent in self management strategies to improve quality of life and functional outcomes. Baseline: New Program Goal status: INITIAL  2.  Patient will report at least 50% improvement in overall symptoms and/or function to demonstrate improved functional mobility Baseline: 0% better Goal status: INITIAL     LONG TERM GOALS: Target date: 10/25/2022   Patient will report at least 75% improvement in overall symptoms and/or function to demonstrate improved functional mobility Baseline: 0% better Goal status: INITIAL  2.  Patient will demonstrate at least 4 out of 5 MMT in bilateral lower extremities. Baseline: see above Goal status: INITIAL  3.  Patient will be able to walk dog comfortably without any pain or limitations for up to 1/2-hour at a time. Baseline: Not up to half hour comfortably at this time Goal status: INITIAL    PLAN:  PT FREQUENCY: 1-2x/week for total of 8 visits over 8 week certification period  PT DURATION: 8 weeks  PLANNED INTERVENTIONS: Therapeutic exercises, Therapeutic activity, Neuromuscular re-education, Balance training, Gait training, Patient/Family education, Self Care, Joint mobilization, Joint manipulation, Stair training, Canalith repositioning, Orthotic/Fit training, DME instructions, Aquatic Therapy, Dry Needling, Electrical stimulation, Spinal manipulation, Spinal mobilization,  Cryotherapy, Moist heat, Traction, Ultrasound, Ionotophoresis /ml Dexamethasone, Manual therapy, and Re-evaluation.  PLAN FOR NEXT SESSION: hip and core strengthening and ROM   10:55 AM, 08/30/22 Tereasa Coop, DPT Physical Therapy with [facility]

## 2022-09-26 ENCOUNTER — Encounter: Payer: BC Managed Care – PPO | Admitting: Physical Therapy

## 2022-09-28 ENCOUNTER — Ambulatory Visit: Payer: BC Managed Care – PPO | Admitting: Family Medicine

## 2022-10-07 NOTE — Progress Notes (Unsigned)
   Rubin Payor, PhD, LAT, ATC acting as a scribe for Clementeen Graham, MD.  Rachael Munoz is a 62 y.o. female who presents to [facility] at [facility] today for f/u R hip and low back pain. Pt was last seen by Dr. Denyse Amass on 08/18/22 and was given a R GT steroid injection, was prescribed gabapentin, and referred to PT, completing 1 visit (and canceling 2nd visit). Today, pt reports ***  Dx imaging: 08/18/22 L-spine & R hip XR  Pertinent review of systems: ***  Relevant historical information: ***   Exam:  LMP 05/09/2006  General: Well Developed, well nourished, and in no acute distress.   MSK: ***    Lab and Radiology Results No results found for this or any previous visit (from the past 72 hour(s)). No results found.     Assessment and Plan: 62 y.o. female with ***   PDMP not reviewed this encounter. No orders of the defined types were placed in this encounter.  No orders of the defined types were placed in this encounter.    Discussed warning signs or symptoms. Please see discharge instructions. Patient expresses understanding.   ***

## 2022-10-10 ENCOUNTER — Ambulatory Visit: Payer: BC Managed Care – PPO | Admitting: Family Medicine

## 2022-10-10 ENCOUNTER — Encounter: Payer: Self-pay | Admitting: Family Medicine

## 2022-10-10 VITALS — BP 142/94 | HR 77 | Ht 63.0 in | Wt 177.8 lb

## 2022-10-10 DIAGNOSIS — M25551 Pain in right hip: Secondary | ICD-10-CM

## 2022-10-10 DIAGNOSIS — G8929 Other chronic pain: Secondary | ICD-10-CM | POA: Diagnosis not present

## 2022-10-10 NOTE — Patient Instructions (Addendum)
Thank you for coming in today.   Recheck with me as needed.   I can repeat injection in or around July 11  Ok to restart PT if we need to.   Recheck with me as needed.

## 2022-12-02 ENCOUNTER — Other Ambulatory Visit: Payer: Self-pay

## 2022-12-02 ENCOUNTER — Ambulatory Visit: Payer: BC Managed Care – PPO | Admitting: Family Medicine

## 2022-12-02 VITALS — BP 152/98 | HR 80 | Ht 63.0 in | Wt 180.0 lb

## 2022-12-02 DIAGNOSIS — M533 Sacrococcygeal disorders, not elsewhere classified: Secondary | ICD-10-CM | POA: Diagnosis not present

## 2022-12-02 DIAGNOSIS — G8929 Other chronic pain: Secondary | ICD-10-CM

## 2022-12-02 DIAGNOSIS — M25551 Pain in right hip: Secondary | ICD-10-CM | POA: Diagnosis not present

## 2022-12-02 NOTE — Progress Notes (Signed)
   Rubin Payor, PhD, LAT, ATC acting as a scribe for Clementeen Graham, MD.  Rachael Munoz is a 62 y.o. female who presents to [facility] at [facility] today for exacerbation of her R hip pain. Pt was last seen by Dr. Denyse Amass on 10/10/22 and was advised to cont HEP. Last R GT steroid injection was on 08/18/22.  Today, pt reports R hip pain has started to return over the last 2 weeks. She recently went to [facility] and did a lot of walking. Pt locates pain to the R side of her low back. No radiating pain. No numbness/tingling. She notes only slight discomfort in the R lateral hip.   Dx imaging: 08/18/22 L-spine & R hip XR   Pertinent review of systems: No fevers or chills  Relevant historical information: Hypertension   Exam:  BP (!) 152/98   Pulse 80   Ht 5\' 3"  (1.6 m)   Wt 180 lb (81.6 kg)   LMP 05/09/2006   SpO2 97%   BMI 31.89 kg/m  General: Well Developed, well nourished, and in no acute distress.   MSK: L-spine: Normal appearing Tender palpation right SI joint region. Normal lumbar motion.  Right hip nontender lateral hip.  Tender palpation SI joint. Intact strength.    Lab and Radiology Results  Procedure: Real-time Ultrasound Guided Injection of SI joint Device: Philips Affiniti 50G/GE Logiq Images permanently stored and available for review in PACS Verbal informed consent obtained.  Discussed risks and benefits of procedure. Warned about infection, bleeding, hyperglycemia damage to structures among others. Patient expresses understanding and agreement Time-out conducted.   Noted no overlying erythema, induration, or other signs of local infection.   Skin prepped in a sterile fashion.   Local anesthesia: Topical Ethyl chloride.   With sterile technique and under real time ultrasound guidance: 40 mg of Kenalog and 2 mL of Marcaine injected into right SI joint. Fluid seen entering the joint capsule.   Completed without difficulty   Pain immediately  resolved suggesting accurate placement of the medication.   Advised to call if fevers/chills, erythema, induration, drainage, or persistent bleeding.   Images permanently stored and available for review in the ultrasound unit.  Impression: Technically successful ultrasound guided injection.         Assessment and Plan: 62 y.o. female with right low back pain/right posterior hip pain.  This is different from previous pain.  Previously pain was thought to be due to trochanteric bursitis.  Today pain thought to be due to SI joint dysfunction.  Plan for SI joint injection.  If needed consider advanced imaging lumbar spine.   PDMP not reviewed this encounter. Orders Placed This Encounter  Procedures   [place] LIMITED JOINT SPACE STRUCTURES LOW RIGHT(NO LINKED CHARGES)    Order Specific Question:   Reason for Exam (SYMPTOM  OR DIAGNOSIS REQUIRED)    Answer:   right SIJ pain    Order Specific Question:   Preferred imaging location?    Answer:   [facility]   No orders of the defined types were placed in this encounter.    Discussed warning signs or symptoms. Please see discharge instructions. Patient expresses understanding.   The above documentation has been reviewed and is accurate and complete Clementeen Graham, M.D.

## 2022-12-02 NOTE — Patient Instructions (Addendum)
Thank you for coming in today.   You received an injection today. Seek immediate medical attention if the joint becomes red, extremely painful, or is oozing fluid.   ENJOY relaxing at the beach!!  Continue home exercises.

## 2022-12-08 ENCOUNTER — Ambulatory Visit (HOSPITAL_BASED_OUTPATIENT_CLINIC_OR_DEPARTMENT_OTHER): Payer: Self-pay | Admitting: Obstetrics & Gynecology

## 2022-12-19 ENCOUNTER — Encounter (HOSPITAL_BASED_OUTPATIENT_CLINIC_OR_DEPARTMENT_OTHER): Payer: Self-pay | Admitting: Obstetrics & Gynecology

## 2022-12-19 ENCOUNTER — Ambulatory Visit (INDEPENDENT_AMBULATORY_CARE_PROVIDER_SITE_OTHER): Payer: BC Managed Care – PPO | Admitting: Obstetrics & Gynecology

## 2022-12-19 ENCOUNTER — Other Ambulatory Visit (HOSPITAL_COMMUNITY)
Admission: RE | Admit: 2022-12-19 | Discharge: 2022-12-19 | Disposition: A | Discharge status: Home / Self Care | Payer: BC Managed Care – PPO | Source: Ambulatory Visit

## 2022-12-19 VITALS — BP 157/85 | HR 59 | Ht 65.0 in | Wt 180.2 lb

## 2022-12-19 DIAGNOSIS — Z86010 Personal history of colonic polyps: Secondary | ICD-10-CM

## 2022-12-19 DIAGNOSIS — M8580 Other specified disorders of bone density and structure, unspecified site: Secondary | ICD-10-CM

## 2022-12-19 DIAGNOSIS — Z124 Encounter for screening for malignant neoplasm of cervix: Secondary | ICD-10-CM

## 2022-12-19 DIAGNOSIS — I10 Essential (primary) hypertension: Secondary | ICD-10-CM | POA: Diagnosis not present

## 2022-12-19 DIAGNOSIS — Z01419 Encounter for gynecological examination (general) (routine) without abnormal findings: Secondary | ICD-10-CM | POA: Diagnosis not present

## 2022-12-19 NOTE — Progress Notes (Signed)
62 y.o. [id number] Divorced White or Caucasian female here for annual exam.  Doing well.  Retired the end of May.    Denies vaginal bleeding.    Patient's last menstrual period was 05/09/2006.          Sexually active: No.  The current method of family planning is post menopausal status.    Exercising: Yes.     Smoker:  no  Health Maintenance: Pap:  09/05/2019 Negative History of abnormal Pap:  no MMG:  03/22/2022 Negative Colonoscopy: 09/22/2021, Dr. Marca Ancona, 5 year followup BMD:   02/25/2019 Mild Osteopenia Screening Labs: 11/30/2021   reports that she quit smoking about 37 years ago. Her smoking use included cigarettes. She started smoking about 47 years ago. She has a 15 pack-year smoking history. She has never used smokeless tobacco. She reports current alcohol use of about 1.0 - 2.0 standard drink of alcohol per week. She reports that she does not use drugs.  Past Medical History:  Diagnosis Date   Allergy    Anemia    mild-donates blood regularly   Colon polyp 11/1997   high grade dysplasia    Fracture of right wrist 2019   Hypothyroid    Melanoma (HCC)    Melanoma in situ (HCC)    Ovarian cyst    right   Seasonal allergies     Past Surgical History:  Procedure Laterality Date   COLONOSCOPY W/ BIOPSIES  7/99   high grade dysplasia   COLONOSCOPY WITH PROPOFOL N/A 12/15/2015   Procedure: COLONOSCOPY WITH PROPOFOL;  Surgeon: Charolett Bumpers, MD;  Location: [facility];  Service: Endoscopy;  Laterality: N/A;   DILATION AND CURETTAGE OF UTERUS     miscarriage   INTRAUTERINE DEVICE (IUD) INSERTION  7/07   IUD REMOVAL  12/12   TUBAL LIGATION      Current Outpatient Medications  Medication Sig Dispense Refill   Azelastine-Fluticasone 137-50 MCG/ACT SUSP Place 2 sprays into the nose daily. 23 g 11   Cholecalciferol (VITAMIN D) 50 MCG (2000 UT) CAPS Take 2 each by mouth daily.     gabapentin (NEURONTIN) 100 MG capsule Take 1 capsule (100 mg total) by mouth at bedtime.  100-300 mg daily at bedtime. 30 capsule 3   losartan (COZAAR) 25 MG tablet Take 1 tablet (25 mg total) by mouth daily. 90 tablet 1   Multiple Vitamins-Minerals (MULTIVITAMIN ADULT PO) Take 2 tablets by mouth daily.     No current facility-administered medications for this visit.    Family History  Problem Relation Age of Onset   CVA Mother 27       83s, smoking, alcohol   Miscarriages / [place] Mother    Depression Mother    Arthritis Mother    Alcohol abuse Mother    Colitis Father        ostomy bag   Leukemia Father        into 36s   Colon cancer Father 22   Tuberculosis Sister        unknown how developed   Heart disease Maternal Grandfather    Diabetes Paternal Grandmother    Arthritis Paternal Grandmother    Heart disease Paternal Grandfather        heart attack   Breast cancer Neg Hx    Esophageal cancer Neg Hx    Stomach cancer Neg Hx    Rectal cancer Neg Hx     ROS: Constitutional: negative Genitourinary:negative  Exam:   BP (!) 149/91 (BP Location:  Right Arm, Patient Position: Sitting, Cuff Size: Large) Comment: Patient states she takes blood pressure medication  Ht 5\' 5"  (1.651 m) Comment: Reported  Wt 180 lb 3.2 oz (81.7 kg)   LMP 05/09/2006   BMI 29.99 kg/m   Height: 5\' 5"  (165.1 cm) (Reported)  General appearance: alert, cooperative and appears stated age Head: Normocephalic, without obvious abnormality, atraumatic Neck: no adenopathy, supple, symmetrical, trachea midline and thyroid normal to inspection and palpation Lungs: clear to auscultation bilaterally Breasts: normal appearance, no masses or tenderness Heart: regular rate and rhythm Abdomen: soft, non-tender; bowel sounds normal; no masses,  no organomegaly Extremities: extremities normal, atraumatic, no cyanosis or edema Skin: Skin color, texture, turgor normal. No rashes or lesions Lymph nodes: Cervical, supraclavicular, and axillary nodes normal. No abnormal inguinal nodes  palpated Neurologic: Grossly normal   Pelvic: External genitalia:  no lesions              Urethra:  normal appearing urethra with no masses, tenderness or lesions              Bartholins and Skenes: normal                 Vagina: normal appearing vagina with normal color and no discharge, no lesions              Cervix: no lesions              Pap taken: Yes.   Bimanual Exam:  Uterus:  normal size, contour, position, consistency, mobility, non-tender              Adnexa: normal adnexa and no mass, fullness, tenderness               Rectovaginal: Confirms               Anus:  normal sphincter tone, no lesions  Chaperone, Ina Homes, CMA, was present for exam.  Assessment/Plan: 1. Well woman exam with routine gynecological exam - Pap smear and HR HPV obtained today - Mammogram 03/2022 - Colonoscopy done last year - Bone mineral density planned for next year - vaccines reviewed/updated  2. Primary hypertension - followed by Dr. Casimiro Needle  3. Osteopenia, unspecified location - planning follow up next year

## 2022-12-19 NOTE — Addendum Note (Signed)
Addended by: Ina Homes B on: 12/19/2022 01:04 PM   Modules accepted: Orders

## 2023-01-02 ENCOUNTER — Telehealth: Payer: Self-pay | Admitting: Family Medicine

## 2023-01-02 DIAGNOSIS — I10 Essential (primary) hypertension: Secondary | ICD-10-CM

## 2023-01-02 MED ORDER — LOSARTAN POTASSIUM 25 MG PO TABS
25.0000 mg | ORAL_TABLET | Freq: Every day | ORAL | 0 refills | Status: DC
Start: 2023-01-02 — End: 2023-04-03

## 2023-01-02 NOTE — Telephone Encounter (Signed)
Rx done. 

## 2023-01-02 NOTE — Telephone Encounter (Signed)
[name] called and would like a refill on the following:  losartan (COZAAR) 25 MG tablet   [id number] [place], [place] - [address] Phone: [phone]  Fax: [phone]     Please advise pt on status of this prescription.

## 2023-01-03 ENCOUNTER — Other Ambulatory Visit: Payer: Self-pay | Admitting: Family Medicine

## 2023-01-03 DIAGNOSIS — I10 Essential (primary) hypertension: Secondary | ICD-10-CM

## 2023-01-11 ENCOUNTER — Other Ambulatory Visit: Payer: Self-pay | Admitting: Family Medicine

## 2023-01-11 DIAGNOSIS — Z0000 Encounter for general adult medical examination without abnormal findings: Secondary | ICD-10-CM

## 2023-01-26 NOTE — Telephone Encounter (Signed)
Pt call and stated she had her flu shot on 01/22/23.

## 2023-03-24 ENCOUNTER — Ambulatory Visit
Admission: RE | Admit: 2023-03-24 | Discharge: 2023-03-24 | Disposition: A | Discharge status: Home / Self Care | Payer: BC Managed Care – PPO | Source: Ambulatory Visit | Attending: Family Medicine

## 2023-03-24 DIAGNOSIS — Z0000 Encounter for general adult medical examination without abnormal findings: Secondary | ICD-10-CM

## 2023-04-02 ENCOUNTER — Other Ambulatory Visit: Payer: Self-pay | Admitting: Family Medicine

## 2023-04-02 DIAGNOSIS — I10 Essential (primary) hypertension: Secondary | ICD-10-CM

## 2023-04-10 ENCOUNTER — Telehealth: Payer: Self-pay | Admitting: Family Medicine

## 2023-04-10 DIAGNOSIS — I10 Essential (primary) hypertension: Secondary | ICD-10-CM

## 2023-04-10 MED ORDER — LOSARTAN POTASSIUM 25 MG PO TABS: 25.0000 mg | ORAL_TABLET | Freq: Every day | ORAL | 0 refills | Status: DC

## 2023-04-10 NOTE — Telephone Encounter (Signed)
Prescription Request  04/10/2023  LOV: 06/21/2022  What is the name of the medication or equipment?     losartan (COZAAR) 25 MG tablet  Have you contacted your pharmacy to request a refill? No   Which pharmacy would you like this sent to?  [id number] [place], [place] - [address] [place] [address] Phone: [phone] Fax: [phone]    Patient notified that their request is being sent to the clinical staff for review and that they should receive a response within 2 business days.   Please advise at Mobile [phone] (mobile)

## 2023-04-10 NOTE — Telephone Encounter (Signed)
Rx done. 

## 2023-04-24 ENCOUNTER — Other Ambulatory Visit: Payer: Self-pay

## 2023-04-24 ENCOUNTER — Ambulatory Visit: Payer: BC Managed Care – PPO | Admitting: Family Medicine

## 2023-04-24 VITALS — BP 148/92 | HR 73 | Ht 65.0 in | Wt 181.0 lb

## 2023-04-24 DIAGNOSIS — M533 Sacrococcygeal disorders, not elsewhere classified: Secondary | ICD-10-CM

## 2023-04-24 NOTE — Progress Notes (Signed)
   Rubin Payor, PhD, LAT, ATC acting as a scribe for Clementeen Graham, MD.  Rachael Munoz is a 62 y.o. female who presents to [facility] today for exacerbation of her R hip pain. Pt was last seen by Dr. Denyse Amass on 7/26/274 and was given a R SI joint steroid injection. Last R GT steroid injection; 08/18/22.  Today, pt reports pain returned when she was raking leaves. he is traveling to [place] after Christmas, leaving the 26th. Pain is located on the R-side of her low back. No radiating pain.   Dx imaging: 08/18/22 L-spine & R hip XR   Pertinent review of systems: No fevers or chills  Relevant historical information: Hypertension and hypothyroidism. Going to [place] on the 26.  Exam:  BP (!) 148/92   Pulse 73   Ht 5\' 5"  (1.651 m)   Wt 181 lb (82.1 kg)   LMP 05/09/2006   SpO2 98%   BMI 30.12 kg/m  General: Well Developed, well nourished, and in no acute distress.   MSK: Right low back normal appearing Tender palpation right SI joint.    Lab and Radiology Results  Procedure: Real-time Ultrasound Guided Injection of right SI joint Device: Philips Affiniti 50G/GE Logiq Images permanently stored and available for review in PACS Verbal informed consent obtained.  Discussed risks and benefits of procedure. Warned about infection, bleeding, hyperglycemia damage to structures among others. Patient expresses understanding and agreement Time-out conducted.   Noted no overlying erythema, induration, or other signs of local infection.   Skin prepped in a sterile fashion.   Local anesthesia: Topical Ethyl chloride.   With sterile technique and under real time ultrasound guidance: 40 mg of Kenalog and 2 mL of Marcaine injected into right SI joint. Fluid seen entering the joint cleft.   Completed without difficulty   Pain immediately resolved suggesting accurate placement of the medication.   Advised to call if fevers/chills, erythema, induration,  drainage, or persistent bleeding.   Images permanently stored and available for review in the ultrasound unit.  Impression: Technically successful ultrasound guided injection.        Assessment and Plan: 62 y.o. female with chronic right low back pain located at [facility] joint.  She had an injection of the right SI joint in July which worked really well.  Plan for repeat injection today.  Continue core strengthening recheck as needed.   PDMP not reviewed this encounter. Orders Placed This Encounter  Procedures   [place] LIMITED JOINT SPACE STRUCTURES LOW RIGHT(NO LINKED CHARGES)    Reason for Exam (SYMPTOM  OR DIAGNOSIS REQUIRED):   low back pain    Preferred imaging location?:   [facility]   No orders of the defined types were placed in this encounter.    Discussed warning signs or symptoms. Please see discharge instructions. Patient expresses understanding.   The above documentation has been reviewed and is accurate and complete Clementeen Graham, M.D.

## 2023-04-24 NOTE — Patient Instructions (Addendum)
Thank you for coming in today.   You received an injection today. Seek immediate medical attention if the joint becomes red, extremely painful, or is oozing fluid.   Have fun at [facility]!!  Return as needed.

## 2023-06-13 ENCOUNTER — Telehealth: Payer: Self-pay

## 2023-06-13 NOTE — Telephone Encounter (Signed)
Left detailed message on machine for patient to schedule an appointment 

## 2023-06-13 NOTE — Telephone Encounter (Signed)
 Copied from CRM [id number]. Topic: Clinical - Red Word Triage >> Jun 13, 2023 11:53 AM Leotis ORN wrote: [facility] that prompted transfer to Nurse Triage: hypertension- losartan  (COZAAR ) 25 MG tablet. She is wanting to schedule an appt for her to increase her medication due to her blood pressure being elevated.  Last reading was BP 148/92 at her appt with  Joane Artist RAMAN, MD Sports Medicine He advised her to make an appt with her PCP-   Patient is in the process of switching to medicare which will be effective in March 2025, she stated if she could not get an appt before then she wants an increase in her losartan .   Patient does not want communication via mychart she would like a callback at  [phone]

## 2023-06-28 ENCOUNTER — Other Ambulatory Visit: Payer: Self-pay | Admitting: Family Medicine

## 2023-06-28 DIAGNOSIS — J310 Chronic rhinitis: Secondary | ICD-10-CM

## 2023-06-30 ENCOUNTER — Ambulatory Visit (INDEPENDENT_AMBULATORY_CARE_PROVIDER_SITE_OTHER): Payer: 59 | Admitting: Family Medicine

## 2023-06-30 ENCOUNTER — Encounter: Payer: Self-pay | Admitting: Family Medicine

## 2023-06-30 VITALS — BP 150/99 | HR 75 | Temp 97.7°F | Ht 65.0 in | Wt 176.3 lb

## 2023-06-30 DIAGNOSIS — Z114 Encounter for screening for human immunodeficiency virus [HIV]: Secondary | ICD-10-CM

## 2023-06-30 DIAGNOSIS — G8929 Other chronic pain: Secondary | ICD-10-CM

## 2023-06-30 DIAGNOSIS — I10 Essential (primary) hypertension: Secondary | ICD-10-CM | POA: Diagnosis not present

## 2023-06-30 DIAGNOSIS — E039 Hypothyroidism, unspecified: Secondary | ICD-10-CM | POA: Diagnosis not present

## 2023-06-30 DIAGNOSIS — E782 Mixed hyperlipidemia: Secondary | ICD-10-CM

## 2023-06-30 DIAGNOSIS — Z1159 Encounter for screening for other viral diseases: Secondary | ICD-10-CM | POA: Diagnosis not present

## 2023-06-30 DIAGNOSIS — M545 Low back pain, unspecified: Secondary | ICD-10-CM

## 2023-06-30 MED ORDER — GABAPENTIN 100 MG PO CAPS: 100.0000 mg | ORAL_CAPSULE | Freq: Every day | ORAL | Status: DC | PRN

## 2023-06-30 MED ORDER — LOSARTAN POTASSIUM 50 MG PO TABS: 50.0000 mg | ORAL_TABLET | Freq: Every day | ORAL | 1 refills | Status: DC

## 2023-06-30 NOTE — Progress Notes (Signed)
 Established Patient Office Visit  Subjective   Patient ID: Rachael Munoz, female    DOB: 10-06-1960  Age: 63 y.o. MRN: [id number]  Chief Complaint  Patient presents with   Hypertension    Pt wanted to check with her medication Losartan. Pt does not check BP at home but can tell when her BP is elevated. Pt has started iron supplements.     Pt is here for follow up today.  HTN - BP is elevated today,. She reports that her BP at home her BP is also elevated. She denies any chest pain, SOB or headaches. We discussed increasing her losartan and she is agreeable. She is also due for her annual bloodwork. Pt has history of HLD however is not currently on medication for this. She has no other cardiac risk factors which were reviewed with patient    Current Outpatient Medications  Medication Instructions   Azelastine-Fluticasone 137-50 MCG/ACT SUSP 2 sprays, Nasal, Daily   calcium-vitamin D (OSCAL WITH D) 500-5 MG-MCG tablet 1 tablet   Cholecalciferol (VITAMIN D) 50 MCG (2000 UT) CAPS 2 each, Daily   ferrous sulfate 325 mg, Daily   gabapentin (NEURONTIN) 100 mg, Oral, Daily PRN, 100-300 mg daily at bedtime.   losartan (COZAAR) 50 mg, Oral, Daily   Multiple Vitamins-Minerals (MULTIVITAMIN ADULT PO) 2 tablets, Daily    Patient Active Problem List   Diagnosis Date Noted   Chronic right-sided low back pain without sciatica 07/09/2023   Chronic rhinitis 06/23/2022   HTN (hypertension) 12/14/2021   HLD (hyperlipidemia) 12/14/2021   Osteopenia 11/30/2021   Family history of colon cancer 02/16/2017   History of colonic polyps 07/17/2014   Hypothyroidism 07/17/2014   Anemia 07/17/2014      Review of Systems  All other systems reviewed and are negative.     Objective:     BP (!) 150/99 (BP Location: Right Arm, Patient Position: Sitting, Cuff Size: Large)   Pulse 75   Temp 97.7 F (36.5 C)   Ht 5\' 5"  (1.651 m)   Wt 176 lb 4.8 oz (80 kg)   LMP 05/09/2006   SpO2 98%   BMI  29.34 kg/m    Physical Exam Vitals reviewed.  Constitutional:      Appearance: Normal appearance. She is well-groomed and normal weight.  Eyes:     Conjunctiva/sclera: Conjunctivae normal.  Neck:     Thyroid: No thyromegaly.  Cardiovascular:     Rate and Rhythm: Normal rate and regular rhythm.     Pulses: Normal pulses.     Heart sounds: S1 normal and S2 normal.  Pulmonary:     Effort: Pulmonary effort is normal.     Breath sounds: Normal breath sounds and air entry.  Abdominal:     General: Bowel sounds are normal.  Musculoskeletal:     Right lower leg: No edema.     Left lower leg: No edema.  Neurological:     Mental Status: She is alert and oriented to person, place, and time. Mental status is at baseline.     Gait: Gait is intact.  Psychiatric:        Mood and Affect: Mood and affect normal.        Speech: Speech normal.        Behavior: Behavior normal.        Judgment: Judgment normal.      No results found for any visits on 06/30/23.    The 10-year ASCVD risk score (Arnett DK,  et al., 2019) is: 10%    Assessment & Plan:  Hypothyroidism, unspecified type Assessment & Plan: Thyroid in the past has been questionable, not currently on medication at this time, will continue yearly monitoring  Orders: -     TSH; Future  Mixed hyperlipidemia Assessment & Plan: LDL is elevated on her last set of labs, Medication not necessarily indicated at this time. Will continue to monitor her lipid panel yearly  Orders: -     Lipid panel; Future  Primary hypertension Assessment & Plan: Chronic, BP uncontrolled, will incease losartan to 50 mg daily and see her back in 2 months for her medicare visit  Orders: -     Comprehensive metabolic panel; Future -     Losartan Potassium; Take 1 tablet (50 mg total) by mouth daily.  Dispense: 90 tablet; Refill: 1  Need for hepatitis C screening test -     Hepatitis C antibody; Future  Encounter for screening for HIV -      HIV Antibody (routine testing w rflx); Future  Chronic right-sided low back pain without sciatica Assessment & Plan: Pt is stable on her current dose of gabapentin, she isn't using as much at nighttime as previously. Will continue as prescribed.  Orders: -     Gabapentin; Take 1 capsule (100 mg total) by mouth daily as needed. 100-300 mg daily at bedtime.    I spent 30 minutes with the patient today on multiple chronic medical issues and counseling on blood pressure.  Return in about 2 months (around 08/28/2023) for Welcome to medicare visit-- please schedule with Dr. Casimiro Needle.    Karie Georges, MD

## 2023-07-06 ENCOUNTER — Other Ambulatory Visit: Payer: Self-pay | Admitting: Family Medicine

## 2023-07-06 ENCOUNTER — Other Ambulatory Visit (INDEPENDENT_AMBULATORY_CARE_PROVIDER_SITE_OTHER): Payer: 59

## 2023-07-06 ENCOUNTER — Telehealth: Payer: Self-pay | Admitting: Family Medicine

## 2023-07-06 DIAGNOSIS — Z1159 Encounter for screening for other viral diseases: Secondary | ICD-10-CM

## 2023-07-06 DIAGNOSIS — E039 Hypothyroidism, unspecified: Secondary | ICD-10-CM | POA: Diagnosis not present

## 2023-07-06 DIAGNOSIS — E782 Mixed hyperlipidemia: Secondary | ICD-10-CM

## 2023-07-06 DIAGNOSIS — I10 Essential (primary) hypertension: Secondary | ICD-10-CM | POA: Diagnosis not present

## 2023-07-06 DIAGNOSIS — Z114 Encounter for screening for human immunodeficiency virus [HIV]: Secondary | ICD-10-CM

## 2023-07-06 LAB — COMPREHENSIVE METABOLIC PANEL
ALT: 14 U/L (ref 0–35)
AST: 20 U/L (ref 0–37)
Albumin: 4.4 g/dL (ref 3.5–5.2)
Alkaline Phosphatase: 52 U/L (ref 39–117)
BUN: 22 mg/dL (ref 6–23)
CO2: 29 meq/L (ref 19–32)
Calcium: 9.6 mg/dL (ref 8.4–10.5)
Chloride: 106 meq/L (ref 96–112)
Creatinine, Ser: 0.79 mg/dL (ref 0.40–1.20)
GFR: 78.76 mL/min (ref >60.00)
Glucose, Bld: 92 mg/dL (ref 70–99)
Potassium: 4 meq/L (ref 3.5–5.1)
Sodium: 142 meq/L (ref 135–145)
Total Bilirubin: 0.6 mg/dL (ref 0.2–1.2)
Total Protein: 7.3 g/dL (ref 6.0–8.3)

## 2023-07-06 LAB — LIPID PANEL
Cholesterol: 238 mg/dL — ABNORMAL HIGH (ref 0–200)
HDL: 56 mg/dL (ref >39.00)
LDL Cholesterol: 163 mg/dL — ABNORMAL HIGH (ref 0–99)
NonHDL: 181.65
Total CHOL/HDL Ratio: 4
Triglycerides: 94 mg/dL (ref 0.0–149.0)
VLDL: 18.8 mg/dL (ref 0.0–40.0)

## 2023-07-06 LAB — TSH: TSH: 4.18 u[IU]/mL (ref 0.35–5.50)

## 2023-07-06 NOTE — Telephone Encounter (Signed)
 Looks like I ordered her hep c and HIV screening at the visit but I do not see the results in the computer. Maybe she is referring to this?

## 2023-07-06 NOTE — Telephone Encounter (Signed)
 Spoke with the patient and informed her of the message below.  Also advised her Hep C is a blood test and there is no injection for this.

## 2023-07-06 NOTE — Telephone Encounter (Signed)
 Pt states she was told she needs a hepatitis injection, but couldn't remember for sure. Please advise if pt needs to be scheduled for injection?   Pt would also like to know if she does need the hepatitis injection if she can get it at her local pharmacy?

## 2023-07-07 LAB — HIV ANTIBODY (ROUTINE TESTING W REFLEX): HIV 1&2 Ab, 4th Generation: NONREACTIVE

## 2023-07-07 LAB — HEPATITIS C ANTIBODY: Hepatitis C Ab: NONREACTIVE

## 2023-07-09 ENCOUNTER — Encounter: Payer: Self-pay | Admitting: Family Medicine

## 2023-07-09 DIAGNOSIS — M545 Low back pain, unspecified: Secondary | ICD-10-CM | POA: Insufficient documentation

## 2023-07-09 NOTE — Assessment & Plan Note (Signed)
 Chronic, BP uncontrolled, will incease losartan to 50 mg daily and see her back in 2 months for her medicare visit

## 2023-07-09 NOTE — Assessment & Plan Note (Signed)
 LDL is elevated on her last set of labs, Medication not necessarily indicated at this time. Will continue to monitor her lipid panel yearly

## 2023-07-09 NOTE — Assessment & Plan Note (Signed)
 Thyroid in the past has been questionable, not currently on medication at this time, will continue yearly monitoring

## 2023-07-09 NOTE — Assessment & Plan Note (Signed)
 Pt is stable on her current dose of gabapentin, she isn't using as much at nighttime as previously. Will continue as prescribed.

## 2023-07-17 ENCOUNTER — Telehealth: Payer: Self-pay | Admitting: Family Medicine

## 2023-07-17 NOTE — Telephone Encounter (Signed)
 Patient dropped off document  [employer] form . Pt has about 4 month worth of azelastin-flutic 137-50 mg and may need PA . The form in red folder

## 2023-07-27 NOTE — Telephone Encounter (Signed)
 Left a detailed message at the patient's cell number stating PCP reviewed the letter and stated she can send in a split prescription for 2 of the medications that are in the Azelastin-Flutic 137-7mcg spray since this is not covered by insurance.  Requested patient call back with her preference.

## 2023-08-02 ENCOUNTER — Telehealth: Payer: Self-pay

## 2023-08-02 ENCOUNTER — Other Ambulatory Visit: Payer: Self-pay | Admitting: Family Medicine

## 2023-08-02 DIAGNOSIS — J310 Chronic rhinitis: Secondary | ICD-10-CM

## 2023-08-02 MED ORDER — FLUTICASONE PROPIONATE 50 MCG/ACT NA SUSP: 2.0000 | Freq: Every day | NASAL | 6 refills | Status: DC

## 2023-08-02 MED ORDER — AZELASTINE HCL 0.1 % NA SOLN: 2.0000 | Freq: Two times a day (BID) | NASAL | 12 refills | Status: DC

## 2023-08-02 NOTE — Telephone Encounter (Signed)
 Copied from CRM [id number]. Topic: Clinical - Medical Advice >> Aug 02, 2023  9:25 AM Elizebeth Brooking wrote: Reason for CRM: Patient called in regarding missed call from Marvia Pickles, stated Would like to wait until she see Dr.Michael on May 2nd to talk about the prescription she has 3 months in stock for now so she should be okay.

## 2023-08-02 NOTE — Telephone Encounter (Signed)
 I changed her nasal sprays to just the individual components and sent rx's to the pharmacy

## 2023-08-02 NOTE — Telephone Encounter (Signed)
 Please see prior phone note regarding Rx for Azelastine nasal spray.

## 2023-08-30 ENCOUNTER — Other Ambulatory Visit (HOSPITAL_COMMUNITY): Payer: Self-pay

## 2023-08-31 ENCOUNTER — Other Ambulatory Visit: Payer: Self-pay | Admitting: Family Medicine

## 2023-08-31 DIAGNOSIS — G8929 Other chronic pain: Secondary | ICD-10-CM

## 2023-08-31 NOTE — Telephone Encounter (Signed)
 Last OV 04/24/23 Next OV not scheduled  Last refill 06/30/23 by Dr. Osborn Blaze

## 2023-09-01 ENCOUNTER — Other Ambulatory Visit: Payer: Self-pay

## 2023-09-01 DIAGNOSIS — G8929 Other chronic pain: Secondary | ICD-10-CM

## 2023-09-01 MED ORDER — GABAPENTIN 100 MG PO CAPS
100.0000 mg | ORAL_CAPSULE | Freq: Every evening | ORAL | 2 refills | Status: DC | PRN
Start: 2023-09-01 — End: 2023-09-08

## 2023-09-01 NOTE — Progress Notes (Signed)
 Spoke with patient, needs refill. PCP adjusted dose. Taking 100-300 mg at bedtime prn.

## 2023-09-08 ENCOUNTER — Ambulatory Visit (INDEPENDENT_AMBULATORY_CARE_PROVIDER_SITE_OTHER): Payer: 59 | Admitting: Family Medicine

## 2023-09-08 ENCOUNTER — Encounter: Payer: Self-pay | Admitting: Family Medicine

## 2023-09-08 VITALS — BP 134/94 | HR 70 | Temp 97.7°F | Ht 63.75 in | Wt 172.8 lb

## 2023-09-08 DIAGNOSIS — J310 Chronic rhinitis: Secondary | ICD-10-CM | POA: Diagnosis not present

## 2023-09-08 DIAGNOSIS — Z780 Asymptomatic menopausal state: Secondary | ICD-10-CM

## 2023-09-08 DIAGNOSIS — G8929 Other chronic pain: Secondary | ICD-10-CM

## 2023-09-08 DIAGNOSIS — I10 Essential (primary) hypertension: Secondary | ICD-10-CM | POA: Diagnosis not present

## 2023-09-08 DIAGNOSIS — Z23 Encounter for immunization: Secondary | ICD-10-CM | POA: Diagnosis not present

## 2023-09-08 DIAGNOSIS — M545 Low back pain, unspecified: Secondary | ICD-10-CM

## 2023-09-08 DIAGNOSIS — Z0000 Encounter for general adult medical examination without abnormal findings: Secondary | ICD-10-CM | POA: Diagnosis not present

## 2023-09-08 MED ORDER — AZELASTINE-FLUTICASONE 137-50 MCG/ACT NA SUSP: NASAL | 1 refills | Status: AC

## 2023-09-08 MED ORDER — GABAPENTIN 100 MG PO CAPS: 100.0000 mg | ORAL_CAPSULE | Freq: Every evening | ORAL | 2 refills | Status: AC | PRN

## 2023-09-08 MED ORDER — LOSARTAN POTASSIUM 100 MG PO TABS: 100.0000 mg | ORAL_TABLET | Freq: Every day | ORAL | 1 refills | Status: DC

## 2023-09-08 NOTE — Progress Notes (Unsigned)
 Subjective:    Rachael Munoz is a 63 y.o. female who presents for a Welcome to Medicare exam.          Objective:    Today's Vitals   09/08/23 0803 09/08/23 0805  BP: (!) 132/92 (!) 134/94  Pulse: 70   Temp: 97.7 F (36.5 C)   TempSrc: Oral   SpO2: 97%   Weight: 172 lb 12.8 oz (78.4 kg)   Height: 5' 3.75" (1.619 m)   Body mass index is 29.89 kg/m.  Medications Outpatient Encounter Medications as of 09/08/2023  Medication Sig   Azelastine -Fluticasone  137-50 MCG/ACT SUSP 2 sprays once a day   calcium-vitamin D  (OSCAL WITH D) 500-5 MG-MCG tablet Take 1 tablet by mouth.   Cholecalciferol (VITAMIN D ) 50 MCG (2000 UT) CAPS Take 2 each by mouth daily.   ferrous sulfate 325 (65 FE) MG EC tablet Take 325 mg by mouth daily. [place] vitamin.   Multiple Vitamins-Minerals (MULTIVITAMIN ADULT PO) Take 2 tablets by mouth daily.   [DISCONTINUED] azelastine  (ASTELIN ) 0.1 % nasal spray Place 2 sprays into both nostrils 2 (two) times daily.   [DISCONTINUED] fluticasone  (FLONASE ) 50 MCG/ACT nasal spray Place 2 sprays into both nostrils daily.   [DISCONTINUED] gabapentin  (NEURONTIN ) 100 MG capsule Take 1-3 capsules (100-300 mg total) by mouth at bedtime as needed. 100-300 mg daily at bedtime.   [DISCONTINUED] losartan  (COZAAR ) 50 MG tablet Take 1 tablet (50 mg total) by mouth daily.   gabapentin  (NEURONTIN ) 100 MG capsule Take 1-3 capsules (100-300 mg total) by mouth at bedtime as needed. 100-300 mg daily at bedtime.   losartan  (COZAAR ) 100 MG tablet Take 1 tablet (100 mg total) by mouth daily.   No facility-administered encounter medications on file as of 09/08/2023.     History: Past Medical History:  Diagnosis Date   Allergy    Anemia    mild-donates blood regularly   Colon polyp 11/1997   high grade dysplasia    Fracture of right wrist 2019   Hypothyroid    Melanoma (HCC)    Melanoma in situ (HCC)    Ovarian cyst    right   Seasonal allergies    Past Surgical History:   Procedure Laterality Date   COLONOSCOPY W/ BIOPSIES  7/99   high grade dysplasia   COLONOSCOPY WITH PROPOFOL  N/A 12/15/2015   Procedure: COLONOSCOPY WITH PROPOFOL ;  Surgeon: Garrett Kallman, MD;  Location: [facility];  Service: Endoscopy;  Laterality: N/A;   DILATION AND CURETTAGE OF UTERUS     miscarriage   INTRAUTERINE DEVICE (IUD) INSERTION  7/07   IUD REMOVAL  12/12   TUBAL LIGATION      Family History  Problem Relation Age of Onset   CVA Mother 46       33s, smoking, alcohol   Miscarriages / [place] Mother    Depression Mother    Arthritis Mother    Alcohol abuse Mother    Colitis Father        ostomy bag   Leukemia Father        into 35s   Colon cancer Father 52   Tuberculosis Sister        unknown how developed   Heart disease Maternal Grandfather    Diabetes Paternal Grandmother    Arthritis Paternal Grandmother    Heart disease Paternal Grandfather        heart attack   Breast cancer Neg Hx    Esophageal cancer Neg Hx  Stomach cancer Neg Hx    Rectal cancer Neg Hx    Social History   Occupational History   Not on file  Tobacco Use   Smoking status: Former    Current packs/day: 0.00    Average packs/day: 1.5 packs/day for 10.0 years (15.0 ttl pk-yrs)    Types: Cigarettes    Start date: 10/20/1975    Quit date: 10/19/1985    Years since quitting: 37.9   Smokeless tobacco: Never  Vaping Use   Vaping status: Never Used  Substance and Sexual Activity   Alcohol use: Yes    Alcohol/week: 1.0 - 2.0 standard drink of alcohol    Types: 1 - 2 Standard drinks or equivalent per week   Drug use: No   Sexual activity: Not Currently    Partners: Male    Birth control/protection: Surgical    Comment: BTL    Tobacco Counseling Counseling given: Not Answered   Immunizations and Health Maintenance Immunization History  Administered Date(s) Administered   Influenza, Quadrivalent, Recombinant, Inj, Pf 03/03/2019   Influenza-Unspecified 02/20/2014,  01/31/2021, 02/04/2022, 01/22/2023   PFIZER(Purple Top)SARS-COV-2 Vaccination 07/05/2019, 07/26/2019, 03/29/2020, 11/23/2020   PNEUMOCOCCAL CONJUGATE-20 09/08/2023   Pfizer Covid-19 Vaccine Bivalent Booster 20yrs & up 02/26/2021   RSV,unspecified 07/08/2023   Tdap 05/09/2010, 11/19/2020   Zoster Recombinant(Shingrix) 01/25/2019, 03/22/2019   Health Maintenance Due  Topic Date Due   COVID-19 Vaccine (6 - 2024-25 season) 01/08/2023    Activities of Daily Living    09/04/2023    9:22 AM  In your present state of health, do you have any difficulty performing the following activities:  Hearing? 0  Vision? 0  Difficulty concentrating or making decisions? 0  Walking or climbing stairs? 0  Dressing or bathing? 0  Doing errands, shopping? 0  Preparing Food and eating ? N  Using the Toilet? N  In the past six months, have you accidently leaked urine? N  Do you have problems with loss of bowel control? N  Managing your Medications? N  Managing your Finances? N  Housekeeping or managing your Housekeeping? N    Physical Exam   Physical Exam Vitals reviewed.  Constitutional:      Appearance: Normal appearance. She is well-groomed and normal weight.  HENT:     Right Ear: Tympanic membrane and ear canal normal.     Left Ear: Tympanic membrane and ear canal normal.     Mouth/Throat:     Mouth: Mucous membranes are moist.     Pharynx: No posterior oropharyngeal erythema.  Eyes:     Conjunctiva/sclera: Conjunctivae normal.  Neck:     Thyroid : No thyromegaly.  Cardiovascular:     Rate and Rhythm: Normal rate and regular rhythm.     Pulses: Normal pulses.     Heart sounds: S1 normal and S2 normal.  Pulmonary:     Effort: Pulmonary effort is normal.     Breath sounds: Normal breath sounds and air entry.  Abdominal:     General: Abdomen is flat. Bowel sounds are normal.     Palpations: Abdomen is soft.  Musculoskeletal:     Right lower leg: No edema.     Left lower leg: No edema.   Lymphadenopathy:     Cervical: No cervical adenopathy.  Neurological:     Mental Status: She is alert and oriented to person, place, and time. Mental status is at baseline.     Gait: Gait is intact.  Psychiatric:  Mood and Affect: Mood and affect normal.        Speech: Speech normal.        Behavior: Behavior normal.        Judgment: Judgment normal.    (optional), or other factors deemed appropriate based on the beneficiary's medical and social history and current clinical standards.  The 10-year ASCVD risk score (Arnett DK, et al., 2019) is: 8.8%   Values used to calculate the score:     Age: 26 years     Sex: Female     Is Non-Hispanic African American: No     Diabetic: No     Tobacco smoker: No     Systolic Blood Pressure: 134 mmHg     Is BP treated: Yes     HDL Cholesterol: 56 mg/dL     Total Cholesterol: 238 mg/dL   Advanced Directives: Does Patient Have a Medical Advance Directive?: Yes Type of Advance Directive: Living will, Healthcare Power of Attorney Does patient want to make changes to medical advance directive?: Yes (MAU/Ambulatory/Procedural Areas - Information given) Copy of Healthcare Power of Attorney in Chart?: No - copy requested  EKG:  normal EKG, normal sinus rhythm, there are no previous tracings available for comparison, normal sinus rhythm, normal EKG with a rate of 63.       Assessment:    This is a routine wellness examination for this patient . Normal physical exam findings today.   Vision/Hearing screen Hearing Screening   500Hz  1000Hz  2000Hz  4000Hz   Right ear Pass Pass Pass Pass  Left ear Pass Pass Pass    Vision Screening   Right eye Left eye Both eyes  Without correction 20/40 20/25   With correction        Goals      Activity and Exercise Increased     Evidence-based guidance:  Review current exercise levels.  Assess patient perspective on exercise or activity level, barriers to increasing activity, motivation and  readiness for change.  Recommend or set healthy exercise goal based on individual tolerance.  Encourage small steps toward making change in amount of exercise or activity.  Urge reduction of sedentary activities or screen time.  Promote group activities within the community or with family or support person.  Consider referral to rehabiliation therapist for assessment and exercise/activity plan.   Notes:        Depression Screen    09/08/2023    7:57 AM 12/19/2022   10:39 AM 12/14/2021    8:28 AM 11/30/2021    8:45 AM  PHQ 2/9 Scores  PHQ - 2 Score 0 0 0 0  PHQ- 9 Score 0  3      Fall Risk    09/08/2023    7:57 AM  Fall Risk   Falls in the past year? 0  Number falls in past yr: 0  Injury with Fall? 0  Risk for fall due to : No Fall Risks  Follow up Falls evaluation completed    Cognitive Function:        09/08/2023    8:21 AM  6CIT Screen  What Year? 0 points  What month? 0 points  What time? 0 points  Count back from 20 2 points  Months in reverse 0 points  Repeat phrase 0 points  Total Score 2 points    Patient Care Team: Aida House, MD as PCP - General (Family Medicine) Lillian Rein, MD as Consulting Physician (Gynecology) Donnel Gail, MD as Referring  Physician (Dermatology)     Plan:   Normal physical exam findings today on the welcome to medicare visit.  I have personally reviewed and noted the following in the patient's chart:   Medical and social history Use of alcohol, tobacco or illicit drugs  Current medications and supplements including opioid prescriptions. Patient is not currently taking opioid prescriptions. Functional ability and status Nutritional status Physical activity Advanced directives List of other physicians Hospitalizations, surgeries, and ER visits in previous 12 months Vitals Screenings to include cognitive, depression, and falls Referrals and appointments  In addition, I have reviewed and discussed with patient  certain preventive protocols, quality metrics, and best practice recommendations. A written personalized care plan for preventive services as well as general preventive health recommendations were provided to patient.     Aida House, MD 09/08/2023

## 2023-09-11 ENCOUNTER — Other Ambulatory Visit: Payer: Self-pay | Admitting: Family Medicine

## 2023-09-11 DIAGNOSIS — Z13820 Encounter for screening for osteoporosis: Secondary | ICD-10-CM

## 2023-09-11 DIAGNOSIS — J310 Chronic rhinitis: Secondary | ICD-10-CM

## 2023-09-11 DIAGNOSIS — Z780 Asymptomatic menopausal state: Secondary | ICD-10-CM

## 2023-09-11 DIAGNOSIS — Z0000 Encounter for general adult medical examination without abnormal findings: Secondary | ICD-10-CM

## 2023-09-11 DIAGNOSIS — I10 Essential (primary) hypertension: Secondary | ICD-10-CM

## 2023-09-11 DIAGNOSIS — Z23 Encounter for immunization: Secondary | ICD-10-CM

## 2023-09-11 DIAGNOSIS — G8929 Other chronic pain: Secondary | ICD-10-CM

## 2023-09-11 DIAGNOSIS — Z1231 Encounter for screening mammogram for malignant neoplasm of breast: Secondary | ICD-10-CM

## 2023-11-13 DIAGNOSIS — D039 Melanoma in situ, unspecified: Secondary | ICD-10-CM

## 2023-11-13 HISTORY — DX: Melanoma in situ, unspecified: D03.9

## 2023-12-15 ENCOUNTER — Encounter: Payer: Self-pay | Admitting: Family Medicine

## 2023-12-15 ENCOUNTER — Ambulatory Visit: Admitting: Family Medicine

## 2023-12-15 VITALS — BP 152/100 | HR 67 | Temp 97.5°F | Ht 63.75 in | Wt 169.6 lb

## 2023-12-15 DIAGNOSIS — I10 Essential (primary) hypertension: Secondary | ICD-10-CM

## 2023-12-15 MED ORDER — AMLODIPINE BESYLATE 5 MG PO TABS: 5.0000 mg | ORAL_TABLET | Freq: Every day | ORAL | 3 refills | Status: DC

## 2023-12-15 NOTE — Progress Notes (Signed)
   Established Patient Office Visit  Subjective   Patient ID: Rachael Munoz, female    DOB: 1961-03-10  Age: 63 y.o. MRN: [id number]  Chief Complaint  Patient presents with   Medical Management of Chronic Issues    Pt is here for HTN follow up. Blood pressure is elevated today, pt reports she ate [place] food last night and thinks that might be why her BP is up. She takes her medications around 4-4:30 am. Repeat was also done and it was the same result. We discussed adding a medication to her losartan  and she is agreeable.     Current Outpatient Medications  Medication Instructions   amLODipine  (NORVASC ) 5 mg, Oral, Daily at bedtime   Azelastine -Fluticasone  137-50 MCG/ACT SUSP 2 sprays once a day   ferrous sulfate 325 mg, Daily   gabapentin  (NEURONTIN ) 100-300 mg, Oral, At bedtime PRN, 100-300 mg daily at bedtime.   losartan  (COZAAR ) 100 mg, Oral, Daily   Multiple Vitamins-Minerals (MULTIVITAMIN ADULT PO) 2 tablets, Daily    Patient Active Problem List   Diagnosis Date Noted   Chronic right-sided low back pain without sciatica 07/09/2023   Chronic rhinitis 06/23/2022   HTN (hypertension) 12/14/2021   HLD (hyperlipidemia) 12/14/2021   Osteopenia 11/30/2021   Family history of colon cancer 02/16/2017   History of colonic polyps 07/17/2014   Hypothyroidism 07/17/2014   Anemia 07/17/2014      Review of Systems  All other systems reviewed and are negative.     Objective:     BP (!) 152/100   Pulse 67   Temp (!) 97.5 F (36.4 C) (Oral)   Ht 5' 3.75 (1.619 m)   Wt 169 lb 9.6 oz (76.9 kg)   LMP 05/09/2006   SpO2 98%   BMI 29.34 kg/m    Physical Exam Vitals reviewed.  Constitutional:      Appearance: Normal appearance. She is well-groomed and normal weight.  Cardiovascular:     Rate and Rhythm: Normal rate and regular rhythm.     Heart sounds: S1 normal and S2 normal.  Pulmonary:     Effort: Pulmonary effort is normal.     Breath sounds: Normal breath  sounds and air entry.  Musculoskeletal:     Right lower leg: No edema.     Left lower leg: No edema.  Neurological:     Mental Status: She is alert and oriented to person, place, and time. Mental status is at baseline.     Gait: Gait is intact.  Psychiatric:        Mood and Affect: Mood and affect normal.        Speech: Speech normal.        Behavior: Behavior normal.      No results found for any visits on 12/15/23.    The 10-year ASCVD risk score (Arnett DK, et al., 2019) is: 11.3%    Assessment & Plan:  Primary hypertension Assessment & Plan: Losartan  is currently at 100 mg daily, will add amlodipine  5 mg daily to her regimen and she was instructed to continue monitoring her BP at home. She will see me again in 2 months for another BP check.   Orders: -     amLODIPine  Besylate; Take 1 tablet (5 mg total) by mouth at bedtime.  Dispense: 30 tablet; Refill: 3     Return in about 2 months (around 02/14/2024) for HTN.    Heron CHRISTELLA Sharper, MD

## 2023-12-20 NOTE — Assessment & Plan Note (Signed)
 Losartan  is currently at 100 mg daily, will add amlodipine  5 mg daily to her regimen and she was instructed to continue monitoring her BP at home. She will see me again in 2 months for another BP check.

## 2024-01-01 ENCOUNTER — Encounter (HOSPITAL_BASED_OUTPATIENT_CLINIC_OR_DEPARTMENT_OTHER): Payer: Self-pay | Admitting: Obstetrics & Gynecology

## 2024-01-01 ENCOUNTER — Ambulatory Visit (INDEPENDENT_AMBULATORY_CARE_PROVIDER_SITE_OTHER): Payer: BC Managed Care – PPO | Admitting: Obstetrics & Gynecology

## 2024-01-01 VITALS — BP 122/84 | HR 77 | Ht 63.75 in | Wt 164.0 lb

## 2024-01-01 DIAGNOSIS — Z780 Asymptomatic menopausal state: Secondary | ICD-10-CM | POA: Diagnosis not present

## 2024-01-01 DIAGNOSIS — M8588 Other specified disorders of bone density and structure, other site: Secondary | ICD-10-CM | POA: Diagnosis not present

## 2024-01-01 DIAGNOSIS — I10 Essential (primary) hypertension: Secondary | ICD-10-CM

## 2024-01-01 DIAGNOSIS — Z01419 Encounter for gynecological examination (general) (routine) without abnormal findings: Secondary | ICD-10-CM

## 2024-01-01 DIAGNOSIS — S70362A Insect bite (nonvenomous), left thigh, initial encounter: Secondary | ICD-10-CM | POA: Diagnosis not present

## 2024-01-01 DIAGNOSIS — W57XXXA Bitten or stung by nonvenomous insect and other nonvenomous arthropods, initial encounter: Secondary | ICD-10-CM | POA: Diagnosis not present

## 2024-01-01 MED ORDER — DOXYCYCLINE HYCLATE 100 MG PO CAPS: 200.0000 mg | ORAL_CAPSULE | Freq: Once | ORAL | 0 refills | Status: AC

## 2024-01-01 NOTE — Progress Notes (Signed)
 Breast and Pelvic Patient name: Rachael Munoz MRN [id number]  Date of birth: [birth year] Chief Complaint:   Breast and Pelvic Exam  History of Present Illness:   Rachael Munoz is a 63 y.o. [id number] Caucasian female being seen today for breast and pelvic exam.  Had some car trouble this morning.  Blood pressure is good today.  She is on antihypertensives now and does see Dr. Ozell.  Last blood work was in February.   Denies vaginal bleeding.    Patient's last menstrual period was 05/09/2006.   Last pap 12/19/2022. Results were: normal. H/O abnormal pap: no Last mammogram: 03/24/2023. Results were: normal. Family h/o breast cancer: no.  MMG is scheduled for November.   Last colonoscopy: 09/22/2021. Results were: normal. Family h/o colorectal cancer: yes Father DEXA:  scheduled 05/21/2023.       01/01/2024    9:00 AM 09/08/2023    7:57 AM 12/19/2022   10:39 AM 12/14/2021    8:28 AM 11/30/2021    8:45 AM  Depression screen PHQ 2/9  Decreased Interest 0 0 0  0  Down, Depressed, Hopeless 0 0 0 0 0  PHQ - 2 Score 0 0 0 0 0  Altered sleeping  0  1   Tired, decreased energy  0  2   Change in appetite  0  0   Feeling bad or failure about yourself   0  0   Trouble concentrating  0  0   Moving slowly or fidgety/restless  0  0   Suicidal thoughts  0  0   PHQ-9 Score  0  3   Difficult doing work/chores    Not difficult at all     Review of Systems:   Pertinent items are noted in HPI Denies any pelvic pain.  Denies urinary or bowel changes.   Pertinent History Reviewed:  Reviewed past medical,surgical, social and family history.  Reviewed problem list, medications and allergies. Physical Assessment:   Vitals:   01/01/24 0857  BP: 122/84  Pulse: 77  SpO2: 99%  Weight: 164 lb (74.4 kg)  Height: 5' 3.75 (1.619 m)  Body mass index is 28.37 kg/m.        Physical Examination:   General appearance - well appearing, and in no distress  Mental status - alert, oriented to person,  place, and time  Psych:  She has a normal mood and affect  Skin - warm and dry, normal color, no suspicious lesions noted  Chest - effort normal, all lung fields clear to auscultation bilaterally  Heart - normal rate and regular rhythm  Neck:  midline trachea, no thyromegaly or nodules  Breasts - breasts appear normal, no suspicious masses, no skin or nipple changes or  axillary nodes  Abdomen - soft, nontender, nondistended, no masses or organomegaly  Pelvic - VULVA: normal appearing vulva with no masses, tenderness or lesions   VAGINA: normal appearing vagina with normal color and discharge, no lesions   CERVIX: normal appearing cervix without discharge or lesions, no CMT  Thin prep pap is not done.  Not indicated.    UTERUS: uterus is felt to be normal size, shape, consistency and nontender   ADNEXA: No adnexal masses or tenderness noted.  Rectal - normal rectal, good sphincter tone, no masses felt.   Skin -- tick present in left inner thigh.  Removed intact.  Small amount of erythema at bite site present  Extremities:  No swelling or varicosities noted  Chaperone  present for exam  No results found for this or any previous visit (from the past 24 hours).  Assessment & Plan:  1. Well woman exam with routine gynecological exam (Primary) - Pap smear 2024 - Mammogram up to date and scheduled for 03/2024 - Colonoscopy 09/2021 - Bone mineral density scheduled 05/2024 - lab work done with PCP, Dr. Ozell - vaccines reviewed/updated  2. Postmenopausal - not on HRT  3. Osteopenia of spine - has BMD scheduled - taking 800 I.U. Vit D in her MVI  4. Primary hypertension - on Losartan  and Amlodipine   5. Tick bite of left thigh, initial encounter  - doxycycline  (VIBRAMYCIN ) 100 MG capsule; Take 2 capsules (200 mg total) by mouth once for 1 dose. Take with food as can cause GI distress.  Dispense: 2 capsule; Refill: 0    No orders of the defined types were placed in this  encounter.   Meds: No orders of the defined types were placed in this encounter.   Follow-up: Return in about 1 year (around 12/31/2024).  Ronal GORMAN Pinal, MD 01/01/2024 9:36 AM

## 2024-01-16 DIAGNOSIS — L905 Scar conditions and fibrosis of skin: Secondary | ICD-10-CM | POA: Diagnosis not present

## 2024-01-16 DIAGNOSIS — D0362 Melanoma in situ of left upper limb, including shoulder: Secondary | ICD-10-CM | POA: Diagnosis not present

## 2024-02-13 ENCOUNTER — Ambulatory Visit: Admitting: Family Medicine

## 2024-02-13 ENCOUNTER — Encounter: Payer: Self-pay | Admitting: Family Medicine

## 2024-02-13 VITALS — BP 128/90 | HR 65 | Temp 97.8°F | Ht 63.75 in | Wt 166.4 lb

## 2024-02-13 DIAGNOSIS — I10 Essential (primary) hypertension: Secondary | ICD-10-CM

## 2024-02-13 NOTE — Progress Notes (Unsigned)
 Established Patient Office Visit  Subjective   Patient ID: Rachael Munoz, female    DOB: 08-09-1960  Age: 63 y.o. MRN: [id number]  Chief Complaint  Patient presents with   Medical Management of Chronic Issues    HPI Discussed the use of AI scribe software for clinical note transcription with the patient, who gave verbal consent to proceed.  History of Present Illness   Rachael Munoz is a 63 year old female with hypertension who presents for a follow-up on her blood pressure management.  She was started on amlodipine  5 mg and feels more tired in the morning since starting the medication. She denies any other side effects. She has not been checking her blood pressure at home but recalls a reading of 122/84 mmHg during a visit on September 9th. This morning, her blood pressure was slightly elevated with the diastolic number being high. She takes losartan  in the morning and amlodipine  at night.  She inquires about a kidney disease test she read about, but her recent labs, including GFR, were normal. She is currently on losartan , which helps protect her kidneys.  She plans to get a flu shot today and has not had COVID. She is cautious about the long-term effects of multiple infections.       Current Outpatient Medications  Medication Instructions   amLODipine  (NORVASC ) 5 mg, Oral, Daily at bedtime   Azelastine -Fluticasone  137-50 MCG/ACT SUSP 2 sprays once a day   ferrous sulfate 325 mg, Daily   gabapentin  (NEURONTIN ) 100-300 mg, Oral, At bedtime PRN, 100-300 mg daily at bedtime.   losartan  (COZAAR ) 100 mg, Oral, Daily   Multiple Vitamins-Minerals (MULTIVITAMIN ADULT PO) 2 tablets, Daily    Patient Active Problem List   Diagnosis Date Noted   Chronic right-sided low back pain without sciatica 07/09/2023   Chronic rhinitis 06/23/2022   HTN (hypertension) 12/14/2021   HLD (hyperlipidemia) 12/14/2021   Osteopenia 11/30/2021   Family history of colon cancer 02/16/2017   History  of colonic polyps 07/17/2014   Hypothyroidism 07/17/2014   Anemia 07/17/2014     Review of Systems  All other systems reviewed and are negative.     Objective:     BP (!) 128/90   Pulse 65   Temp 97.8 F (36.6 C) (Oral)   Ht 5' 3.75 (1.619 m)   Wt 166 lb 6.4 oz (75.5 kg)   LMP 05/09/2006   SpO2 98%   BMI 28.79 kg/m    Physical Exam Vitals reviewed.  Constitutional:      Appearance: Normal appearance. She is well-groomed and normal weight.  Eyes:     Conjunctiva/sclera: Conjunctivae normal.  Neck:     Thyroid : No thyromegaly.  Cardiovascular:     Rate and Rhythm: Normal rate and regular rhythm.     Pulses: Normal pulses.     Heart sounds: S1 normal and S2 normal.  Pulmonary:     Effort: Pulmonary effort is normal.     Breath sounds: Normal breath sounds and air entry.  Abdominal:     General: Bowel sounds are normal.  Musculoskeletal:     Right lower leg: No edema.     Left lower leg: No edema.  Neurological:     Mental Status: She is alert and oriented to person, place, and time. Mental status is at baseline.     Gait: Gait is intact.  Psychiatric:        Mood and Affect: Mood and affect normal.  Speech: Speech normal.        Behavior: Behavior normal.        Judgment: Judgment normal.      No results found for any visits on 02/13/24.    The 10-year ASCVD risk score (Arnett DK, et al., 2019) is: 8.1%    Assessment & Plan:  Primary hypertension   Assessment and Plan    Essential hypertension Blood pressure is currently 134/90 mmHg, with systolic pressure being the primary concern for cardiac risk. Diastolic pressure is expected to decrease over time. No significant side effects from amlodipine , though there is some morning fatigue and crusty eyes. Blood pressure control is satisfactory. - Continue amlodipine  and losartan . - Recheck blood pressure at the end of the visit. - Monitor blood pressure at home if possible. - Consider urine  protein test if kidney function changes or blood pressure becomes uncontrollable.  Status post left shoulder skin lesion excision Excision site is healing well with no signs of infection. Stitches were removed on September 19th. No complications reported. - Continue wound care with Moderma lotion.  General Health Maintenance Discussed the importance of vaccinations, including the flu shot and the new COVID booster. She is scheduled to receive a flu shot today and is considering the COVID booster in two weeks. Discussed the potential long-term effects of COVID and the benefits of vaccination. Blood donation is considered safe and can be resumed once blood pressure is stable. - Receive flu shot today. - Plan to receive COVID booster in two weeks. - Consider resuming blood donation in December or January.        Return in about 5 months (around 07/08/2024) for annual physical exam -- come fasting for blood work.    Heron CHRISTELLA Sharper, MD

## 2024-03-05 ENCOUNTER — Other Ambulatory Visit: Payer: Self-pay | Admitting: Family Medicine

## 2024-03-05 DIAGNOSIS — I10 Essential (primary) hypertension: Secondary | ICD-10-CM

## 2024-03-07 ENCOUNTER — Telehealth: Payer: Self-pay

## 2024-03-07 NOTE — Telephone Encounter (Signed)
 Copied from CRM [id number]. Topic: Clinical - Medication Question >> Mar 07, 2024 10:08 AM Alfonso HERO wrote: Reason for CRM: patient wants to know which of the 2 COVID vaccines she needs to get since she is now 75.

## 2024-03-08 ENCOUNTER — Encounter: Payer: Self-pay | Admitting: Family Medicine

## 2024-03-08 NOTE — Telephone Encounter (Signed)
 Yes would recommend -- she only needs the booster for this season

## 2024-03-08 NOTE — Telephone Encounter (Signed)
 See My chart message

## 2024-03-25 ENCOUNTER — Ambulatory Visit

## 2024-03-25 ENCOUNTER — Ambulatory Visit
Admission: RE | Admit: 2024-03-25 | Discharge: 2024-03-25 | Disposition: A | Discharge status: Home / Self Care | Source: Ambulatory Visit | Attending: Family Medicine | Admitting: Family Medicine

## 2024-03-25 DIAGNOSIS — Z1231 Encounter for screening mammogram for malignant neoplasm of breast: Secondary | ICD-10-CM | POA: Diagnosis not present

## 2024-03-27 ENCOUNTER — Other Ambulatory Visit: Payer: Self-pay | Admitting: Family Medicine

## 2024-03-27 DIAGNOSIS — R928 Other abnormal and inconclusive findings on diagnostic imaging of breast: Secondary | ICD-10-CM

## 2024-03-29 ENCOUNTER — Ambulatory Visit: Payer: Self-pay | Admitting: Family Medicine

## 2024-04-06 ENCOUNTER — Other Ambulatory Visit: Payer: Self-pay | Admitting: Family Medicine

## 2024-04-06 DIAGNOSIS — I10 Essential (primary) hypertension: Secondary | ICD-10-CM

## 2024-04-16 ENCOUNTER — Ambulatory Visit
Admission: RE | Admit: 2024-04-16 | Discharge: 2024-04-16 | Disposition: A | Source: Ambulatory Visit | Attending: Family Medicine

## 2024-04-16 DIAGNOSIS — R928 Other abnormal and inconclusive findings on diagnostic imaging of breast: Secondary | ICD-10-CM

## 2024-04-16 DIAGNOSIS — N6489 Other specified disorders of breast: Secondary | ICD-10-CM | POA: Diagnosis not present

## 2024-04-17 ENCOUNTER — Ambulatory Visit: Payer: Self-pay | Admitting: Family Medicine

## 2024-05-20 ENCOUNTER — Ambulatory Visit: Payer: Self-pay | Admitting: Family Medicine

## 2024-05-20 ENCOUNTER — Ambulatory Visit (HOSPITAL_BASED_OUTPATIENT_CLINIC_OR_DEPARTMENT_OTHER)
Admission: RE | Admit: 2024-05-20 | Discharge: 2024-05-20 | Disposition: A | Discharge status: Home / Self Care | Source: Ambulatory Visit | Attending: Family Medicine | Admitting: Family Medicine

## 2024-05-20 DIAGNOSIS — Z780 Asymptomatic menopausal state: Secondary | ICD-10-CM | POA: Insufficient documentation

## 2024-05-20 DIAGNOSIS — Z13820 Encounter for screening for osteoporosis: Secondary | ICD-10-CM | POA: Diagnosis present

## 2024-05-21 ENCOUNTER — Other Ambulatory Visit

## 2024-07-10 ENCOUNTER — Encounter: Admitting: Family Medicine

## 2025-01-06 ENCOUNTER — Ambulatory Visit (HOSPITAL_BASED_OUTPATIENT_CLINIC_OR_DEPARTMENT_OTHER): Admitting: Obstetrics & Gynecology
